# Patient Record
Sex: Male | Born: 1955
Health system: Southern US, Community
[De-identification: ages and names within clinical notes are randomized; demographics above are authoritative.]

## PROBLEM LIST (undated history)

## (undated) DIAGNOSIS — C801 Malignant (primary) neoplasm, unspecified: Secondary | ICD-10-CM

## (undated) DIAGNOSIS — K047 Periapical abscess without sinus: Principal | ICD-10-CM

## (undated) DIAGNOSIS — A159 Respiratory tuberculosis unspecified: Secondary | ICD-10-CM

## (undated) DIAGNOSIS — C8238 Follicular lymphoma grade IIIa, lymph nodes of multiple sites: Secondary | ICD-10-CM

## (undated) DIAGNOSIS — Z531 Procedure and treatment not carried out because of patient's decision for reasons of belief and group pressure: Secondary | ICD-10-CM

## (undated) DIAGNOSIS — M1990 Unspecified osteoarthritis, unspecified site: Secondary | ICD-10-CM

## (undated) DIAGNOSIS — R221 Localized swelling, mass and lump, neck: Secondary | ICD-10-CM

## (undated) HISTORY — DX: Periapical abscess without sinus: K04.7

## (undated) HISTORY — DX: Procedure and treatment not carried out because of patient's decision for reasons of belief and group pressure: Z53.1

## (undated) HISTORY — DX: Follicular lymphoma grade iiia, lymph nodes of multiple sites: C82.38

## (undated) HISTORY — DX: Malignant (primary) neoplasm, unspecified: C80.1

## (undated) HISTORY — PX: CHOLECYSTECTOMY: SHX55

## (undated) HISTORY — PX: VASECTOMY: SHX75

## (undated) HISTORY — PX: COLONOSCOPY: SHX174

---

## 2002-06-30 HISTORY — PX: VASECTOMY REVERSAL: SHX243

## 2007-07-01 DIAGNOSIS — A159 Respiratory tuberculosis unspecified: Secondary | ICD-10-CM

## 2007-07-01 HISTORY — DX: Respiratory tuberculosis unspecified: A15.9

## 2010-06-08 ENCOUNTER — Ambulatory Visit
Admission: RE | Admit: 2010-06-08 | Discharge: 2010-06-08 | Payer: Self-pay | Source: Home / Self Care | Attending: Internal Medicine | Admitting: Internal Medicine

## 2010-06-08 ENCOUNTER — Emergency Department (HOSPITAL_COMMUNITY)
Admission: EM | Admit: 2010-06-08 | Discharge: 2010-06-08 | Payer: Self-pay | Source: Home / Self Care | Admitting: Emergency Medicine

## 2010-06-11 ENCOUNTER — Inpatient Hospital Stay (HOSPITAL_COMMUNITY): Admission: EM | Admit: 2010-06-11 | Discharge: 2010-06-14 | Payer: Self-pay | Source: Home / Self Care

## 2010-06-12 ENCOUNTER — Encounter (INDEPENDENT_AMBULATORY_CARE_PROVIDER_SITE_OTHER): Payer: Self-pay

## 2010-09-09 LAB — COMPREHENSIVE METABOLIC PANEL
Albumin: 2.8 g/dL — ABNORMAL LOW (ref 3.5–5.2)
Alkaline Phosphatase: 211 U/L — ABNORMAL HIGH (ref 39–117)
CO2: 30 mEq/L (ref 19–32)
Calcium: 8.4 mg/dL (ref 8.4–10.5)
Chloride: 100 mEq/L (ref 96–112)
GFR calc Af Amer: >60 mL/min (ref >60)
GFR calc non Af Amer: >60 mL/min (ref >60)
Glucose, Bld: 126 mg/dL — ABNORMAL HIGH (ref 70–99)
Potassium: 4 mEq/L (ref 3.5–5.1)
Sodium: 138 mEq/L (ref 135–145)

## 2010-09-09 LAB — CBC
HCT: 36.9 % — ABNORMAL LOW (ref 39.0–52.0)
MCHC: 33.3 g/dL (ref 30.0–36.0)
Platelets: 247 10*3/uL (ref 150–400)
RDW: 13.8 % (ref 11.5–15.5)

## 2010-09-10 LAB — DIFFERENTIAL
Basophils Relative: 0 % (ref 0–1)
Eosinophils Absolute: 0 10*3/uL (ref 0.0–0.7)
Lymphocytes Relative: 4 % — ABNORMAL LOW (ref 12–46)
Monocytes Absolute: 1.2 10*3/uL — ABNORMAL HIGH (ref 0.1–1.0)
Monocytes Relative: 9 % (ref 3–12)
Neutro Abs: 11.7 10*3/uL — ABNORMAL HIGH (ref 1.7–7.7)
Neutrophils Relative %: 87 % — ABNORMAL HIGH (ref 43–77)

## 2010-09-10 LAB — COMPREHENSIVE METABOLIC PANEL
Albumin: 3.7 g/dL (ref 3.5–5.2)
Alkaline Phosphatase: 192 U/L — ABNORMAL HIGH (ref 39–117)
GFR calc non Af Amer: >60 mL/min (ref >60)
Potassium: 3.5 mEq/L (ref 3.5–5.1)
Sodium: 138 mEq/L (ref 135–145)
Total Bilirubin: 2 mg/dL — ABNORMAL HIGH (ref 0.3–1.2)

## 2010-09-10 LAB — CBC
Hemoglobin: 13.5 g/dL (ref 13.0–17.0)
MCH: 30.2 pg (ref 26.0–34.0)
MCHC: 33.7 g/dL (ref 30.0–36.0)
MCHC: 34.4 g/dL (ref 30.0–36.0)
MCV: 87.9 fL (ref 78.0–100.0)
Platelets: 205 10*3/uL (ref 150–400)
Platelets: 213 10*3/uL (ref 150–400)
RBC: 4.27 MIL/uL (ref 4.22–5.81)
RDW: 13.3 % (ref 11.5–15.5)
RDW: 13.7 % (ref 11.5–15.5)
WBC: 13.4 10*3/uL — ABNORMAL HIGH (ref 4.0–10.5)

## 2010-09-10 LAB — AMYLASE: Amylase: 42 U/L (ref 0–105)

## 2010-09-10 LAB — CREATININE, SERUM
GFR calc Af Amer: >60 mL/min (ref >60)
GFR calc non Af Amer: >60 mL/min (ref >60)

## 2010-09-10 LAB — POTASSIUM: Potassium: 3.7 mEq/L (ref 3.5–5.1)

## 2010-09-10 LAB — HEPATIC FUNCTION PANEL
ALT: 271 U/L — ABNORMAL HIGH (ref 0–53)
AST: 162 U/L — ABNORMAL HIGH (ref 0–37)
Bilirubin, Direct: 0.5 mg/dL — ABNORMAL HIGH (ref 0.0–0.3)
Indirect Bilirubin: 0.9 mg/dL (ref 0.3–0.9)

## 2014-10-31 ENCOUNTER — Other Ambulatory Visit (HOSPITAL_COMMUNITY): Payer: Self-pay | Admitting: Otolaryngology

## 2014-10-31 ENCOUNTER — Other Ambulatory Visit (HOSPITAL_COMMUNITY)
Admission: RE | Admit: 2014-10-31 | Discharge: 2014-10-31 | Disposition: A | Discharge status: Home / Self Care | Payer: 59 | Source: Ambulatory Visit | Attending: Otolaryngology | Admitting: Otolaryngology

## 2014-10-31 DIAGNOSIS — R221 Localized swelling, mass and lump, neck: Secondary | ICD-10-CM | POA: Diagnosis not present

## 2014-11-21 ENCOUNTER — Encounter (HOSPITAL_BASED_OUTPATIENT_CLINIC_OR_DEPARTMENT_OTHER): Payer: Self-pay | Admitting: *Deleted

## 2014-11-28 ENCOUNTER — Encounter (HOSPITAL_BASED_OUTPATIENT_CLINIC_OR_DEPARTMENT_OTHER)
Admission: RE | Disposition: A | Discharge status: Home / Self Care | Payer: Self-pay | Source: Ambulatory Visit | Attending: Otolaryngology

## 2014-11-28 ENCOUNTER — Encounter (HOSPITAL_BASED_OUTPATIENT_CLINIC_OR_DEPARTMENT_OTHER): Payer: Self-pay | Admitting: Certified Registered"

## 2014-11-28 ENCOUNTER — Ambulatory Visit (HOSPITAL_BASED_OUTPATIENT_CLINIC_OR_DEPARTMENT_OTHER)
Admission: RE | Admit: 2014-11-28 | Discharge: 2014-11-28 | Disposition: A | Discharge status: Home / Self Care | Payer: 59 | Source: Ambulatory Visit | Attending: Otolaryngology | Admitting: Otolaryngology

## 2014-11-28 ENCOUNTER — Ambulatory Visit (HOSPITAL_BASED_OUTPATIENT_CLINIC_OR_DEPARTMENT_OTHER): Payer: 59 | Admitting: Certified Registered"

## 2014-11-28 DIAGNOSIS — C8231 Follicular lymphoma grade IIIa, lymph nodes of head, face, and neck: Secondary | ICD-10-CM | POA: Insufficient documentation

## 2014-11-28 HISTORY — PX: MASS BIOPSY: SHX5445

## 2014-11-28 HISTORY — DX: Localized swelling, mass and lump, neck: R22.1

## 2014-11-28 LAB — POCT HEMOGLOBIN-HEMACUE: Hemoglobin: 14.8 g/dL (ref 13.0–17.0)

## 2014-11-28 SURGERY — BIOPSY, MASS, NECK
Anesthesia: General | Site: Neck | Laterality: Left

## 2014-11-28 MED ORDER — DEXAMETHASONE SODIUM PHOSPHATE 4 MG/ML IJ SOLN
INTRAMUSCULAR | Status: DC | PRN
  Administered 2014-11-28: 10 mg via INTRAVENOUS

## 2014-11-28 MED ORDER — LIDOCAINE HCL (CARDIAC) 20 MG/ML IV SOLN
INTRAVENOUS | Status: DC | PRN
  Administered 2014-11-28: 50 mg via INTRAVENOUS

## 2014-11-28 MED ORDER — OXYCODONE HCL 5 MG/5ML PO SOLN: 5.0000 mg | Freq: Once | ORAL | Status: DC | PRN

## 2014-11-28 MED ORDER — PROPOFOL 500 MG/50ML IV EMUL
INTRAVENOUS | Status: AC
  Filled 2014-11-28: qty 50

## 2014-11-28 MED ORDER — KETOROLAC TROMETHAMINE 30 MG/ML IJ SOLN: 30.0000 mg | Freq: Once | INTRAMUSCULAR | Status: DC | PRN

## 2014-11-28 MED ORDER — PROPOFOL 10 MG/ML IV BOLUS
INTRAVENOUS | Status: DC | PRN
  Administered 2014-11-28: 140 mg via INTRAVENOUS

## 2014-11-28 MED ORDER — PROMETHAZINE HCL 25 MG/ML IJ SOLN: 6.2500 mg | INTRAMUSCULAR | Status: DC | PRN

## 2014-11-28 MED ORDER — MIDAZOLAM HCL 2 MG/2ML IJ SOLN
1.0000 mg | INTRAMUSCULAR | Status: DC | PRN
  Administered 2014-11-28: 2 mg via INTRAVENOUS

## 2014-11-28 MED ORDER — OXYMETAZOLINE HCL 0.05 % NA SOLN
NASAL | Status: AC
  Filled 2014-11-28: qty 15

## 2014-11-28 MED ORDER — FENTANYL CITRATE (PF) 100 MCG/2ML IJ SOLN: 50.0000 ug | INTRAMUSCULAR | Status: DC | PRN

## 2014-11-28 MED ORDER — BACITRACIN ZINC 500 UNIT/GM EX OINT
TOPICAL_OINTMENT | CUTANEOUS | Status: AC
  Filled 2014-11-28: qty 0.9

## 2014-11-28 MED ORDER — FENTANYL CITRATE (PF) 100 MCG/2ML IJ SOLN
INTRAMUSCULAR | Status: AC
  Filled 2014-11-28: qty 6

## 2014-11-28 MED ORDER — HYDROMORPHONE HCL 1 MG/ML IJ SOLN: 0.2500 mg | INTRAMUSCULAR | Status: DC | PRN

## 2014-11-28 MED ORDER — FENTANYL CITRATE (PF) 100 MCG/2ML IJ SOLN
INTRAMUSCULAR | Status: DC | PRN
  Administered 2014-11-28: 100 ug via INTRAVENOUS

## 2014-11-28 MED ORDER — LACTATED RINGERS IV SOLN
INTRAVENOUS | Status: DC
  Administered 2014-11-28: 10 mL/h via INTRAVENOUS
  Administered 2014-11-28: 10:00:00 via INTRAVENOUS

## 2014-11-28 MED ORDER — MEPERIDINE HCL 25 MG/ML IJ SOLN: 6.2500 mg | INTRAMUSCULAR | Status: DC | PRN

## 2014-11-28 MED ORDER — OXYCODONE HCL 5 MG PO TABS: 5.0000 mg | ORAL_TABLET | Freq: Once | ORAL | Status: DC | PRN

## 2014-11-28 MED ORDER — GLYCOPYRROLATE 0.2 MG/ML IJ SOLN: 0.2000 mg | Freq: Once | INTRAMUSCULAR | Status: DC | PRN

## 2014-11-28 MED ORDER — SUCCINYLCHOLINE CHLORIDE 20 MG/ML IJ SOLN
INTRAMUSCULAR | Status: AC
  Filled 2014-11-28: qty 2

## 2014-11-28 MED ORDER — LIDOCAINE-EPINEPHRINE 1 %-1:100000 IJ SOLN
INTRAMUSCULAR | Status: AC
  Filled 2014-11-28: qty 1

## 2014-11-28 MED ORDER — ONDANSETRON HCL 4 MG/2ML IJ SOLN
INTRAMUSCULAR | Status: DC | PRN
  Administered 2014-11-28: 4 mg via INTRAVENOUS

## 2014-11-28 MED ORDER — MIDAZOLAM HCL 2 MG/2ML IJ SOLN
INTRAMUSCULAR | Status: AC
  Filled 2014-11-28: qty 2

## 2014-11-28 SURGICAL SUPPLY — 50 items
ATTRACTOMAT 16X20 MAGNETIC DRP (DRAPES) IMPLANT
BLADE SURG 15 STRL LF DISP TIS (BLADE) ×1 IMPLANT
BLADE SURG 15 STRL SS (BLADE) ×1
CANISTER SUCT 1200ML W/VALVE (MISCELLANEOUS) ×2 IMPLANT
CLEANER CAUTERY TIP 5X5 PAD (MISCELLANEOUS) ×1 IMPLANT
CORDS BIPOLAR (ELECTRODE) IMPLANT
COTTONBALL LRG STERILE PKG (GAUZE/BANDAGES/DRESSINGS) IMPLANT
COVER BACK TABLE 60X90IN (DRAPES) ×2 IMPLANT
COVER MAYO STAND STRL (DRAPES) ×2 IMPLANT
DECANTER SPIKE VIAL GLASS SM (MISCELLANEOUS) IMPLANT
DRAIN JACKSON RD 7FR 3/32 (WOUND CARE) IMPLANT
DRAIN PENROSE 1/4X12 LTX STRL (WOUND CARE) IMPLANT
DRAPE U-SHAPE 76X120 STRL (DRAPES) ×2 IMPLANT
DRSG EMULSION OIL 3X3 NADH (GAUZE/BANDAGES/DRESSINGS) IMPLANT
ELECT COATED BLADE 2.86 ST (ELECTRODE) ×2 IMPLANT
ELECT NEEDLE BLADE 2-5/6 (NEEDLE) IMPLANT
ELECT REM PT RETURN 9FT ADLT (ELECTROSURGICAL) ×2
ELECTRODE REM PT RTRN 9FT ADLT (ELECTROSURGICAL) ×1 IMPLANT
EVACUATOR SILICONE 100CC (DRAIN) IMPLANT
GAUZE SPONGE 4X4 16PLY XRAY LF (GAUZE/BANDAGES/DRESSINGS) IMPLANT
GLOVE SS BIOGEL STRL SZ 7.5 (GLOVE) ×1 IMPLANT
GLOVE SUPERSENSE BIOGEL SZ 7.5 (GLOVE) ×1
GOWN STRL REUS W/ TWL LRG LVL3 (GOWN DISPOSABLE) ×2 IMPLANT
GOWN STRL REUS W/TWL LRG LVL3 (GOWN DISPOSABLE) ×2
LIQUID BAND (GAUZE/BANDAGES/DRESSINGS) IMPLANT
LOCATOR NERVE 3 VOLT (DISPOSABLE) IMPLANT
NEEDLE PRECISIONGLIDE 27X1.5 (NEEDLE) ×2 IMPLANT
NS IRRIG 1000ML POUR BTL (IV SOLUTION) IMPLANT
PACK BASIN DAY SURGERY FS (CUSTOM PROCEDURE TRAY) ×2 IMPLANT
PAD CLEANER CAUTERY TIP 5X5 (MISCELLANEOUS) ×1
PENCIL FOOT CONTROL (ELECTRODE) ×2 IMPLANT
SHEET MEDIUM DRAPE 40X70 STRL (DRAPES) IMPLANT
SLEEVE SCD COMPRESS KNEE MED (MISCELLANEOUS) ×2 IMPLANT
SPONGE GAUZE 2X2 8PLY STRL LF (GAUZE/BANDAGES/DRESSINGS) IMPLANT
SPONGE GAUZE 4X4 12PLY STER LF (GAUZE/BANDAGES/DRESSINGS) IMPLANT
STAPLER VISISTAT 35W (STAPLE) IMPLANT
SUCTION FRAZIER TIP 10 FR DISP (SUCTIONS) IMPLANT
SUT CHROMIC 3 0 PS 2 (SUTURE) IMPLANT
SUT CHROMIC 4 0 P 3 18 (SUTURE) IMPLANT
SUT ETHILON 4 0 PS 2 18 (SUTURE) IMPLANT
SUT ETHILON 5 0 P 3 18 (SUTURE)
SUT NYLON ETHILON 5-0 P-3 1X18 (SUTURE) IMPLANT
SUT SILK 3 0 TIES 17X18 (SUTURE)
SUT SILK 3-0 18XBRD TIE BLK (SUTURE) IMPLANT
SUT SILK 4 0 TIES 17X18 (SUTURE) IMPLANT
SYR BULB 3OZ (MISCELLANEOUS) IMPLANT
SYR CONTROL 10ML LL (SYRINGE) ×2 IMPLANT
TOWEL OR 17X24 6PK STRL BLUE (TOWEL DISPOSABLE) IMPLANT
TRAY DSU PREP LF (CUSTOM PROCEDURE TRAY) ×2 IMPLANT
TUBE CONNECTING 20X1/4 (TUBING) ×2 IMPLANT

## 2014-11-28 NOTE — Transfer of Care (Signed)
Immediate Anesthesia Transfer of Care Note  Patient: Travis Burton  Procedure(s) Performed: Procedure(s): OPEN  BIOPSY LEFT NECK MASS (Left)  Patient Location: PACU  Anesthesia Type:General  Level of Consciousness: awake, sedated and responds to stimulation  Airway & Oxygen Therapy: Patient Spontanous Breathing and Patient connected to face mask oxygen  Post-op Assessment: Report given to RN, Post -op Vital signs reviewed and stable and Patient moving all extremities  Post vital signs: Reviewed and stable  Last Vitals:  Filed Vitals:   11/28/14 0919  BP: 151/79  Pulse: 78  Temp: 36.6 C  Resp: 16    Complications: No apparent anesthesia complications

## 2014-11-28 NOTE — H&P (Signed)
Travis Burton is an 59 y.o. male.   Chief Complaint: left neck mass HPI: hx of mass in the neck and needs biopsy  Past Medical History  Diagnosis Date  . Neck mass     left    Past Surgical History  Procedure Laterality Date  . Cholecystectomy    . Vasectomy      History reviewed. No pertinent family history. Social History:  reports that he has never smoked. He does not have any smokeless tobacco history on file. He reports that he drinks alcohol. He reports that he does not use illicit drugs.  Allergies: No Known Allergies  No prescriptions prior to admission    Results for orders placed or performed during the hospital encounter of 11/28/14 (from the past 48 hour(s))  Hemoglobin-hemacue, POC     Status: None   Collection Time: 11/28/14  9:35 AM  Result Value Ref Range   Hemoglobin 14.8 13.0 - 17.0 g/dL   No results found.  Review of Systems  Constitutional: Negative.   HENT: Negative.   Eyes: Negative.   Respiratory: Negative.   Cardiovascular: Negative.   Skin: Negative.   Neurological: Negative.     Blood pressure 151/79, pulse 78, temperature 97.8 F (36.6 C), temperature source Oral, resp. rate 16, height 5\' 7"  (1.702 m), weight 67.132 kg (148 lb), SpO2 100 %. Physical Exam  Constitutional: He appears well-developed and well-nourished.  HENT:  Head: Normocephalic and atraumatic.  Nose: Nose normal.  Mouth/Throat: Oropharynx is clear and moist.  Eyes: Pupils are equal, round, and reactive to light.  Cardiovascular: Normal rate.   Respiratory: Effort normal.  GI: Soft.  Musculoskeletal: Normal range of motion.  Lymphadenopathy:    He has cervical adenopathy.     Assessment/Plan Left neck mass- discussed biopsy and ready to proceed  Melissa Montane 11/28/2014, 9:57 AM

## 2014-11-28 NOTE — Anesthesia Preprocedure Evaluation (Signed)
Anesthesia Evaluation  Patient identified by MRN, date of birth, ID band Patient awake    Reviewed: Allergy & Precautions, NPO status , Patient's Chart, lab work & pertinent test results  Airway Mallampati: II  TM Distance: >3 FB Neck ROM: Full    Dental no notable dental hx.    Pulmonary neg pulmonary ROS,  breath sounds clear to auscultation  Pulmonary exam normal       Cardiovascular negative cardio ROS Normal cardiovascular examRhythm:Regular Rate:Normal     Neuro/Psych negative neurological ROS  negative psych ROS   GI/Hepatic negative GI ROS, Neg liver ROS,   Endo/Other  negative endocrine ROS  Renal/GU negative Renal ROS     Musculoskeletal negative musculoskeletal ROS (+)   Abdominal   Peds  Hematology negative hematology ROS (+)   Anesthesia Other Findings   Reproductive/Obstetrics                             Anesthesia Physical Anesthesia Plan  ASA: I  Anesthesia Plan: General   Post-op Pain Management:    Induction: Intravenous  Airway Management Planned: LMA and Oral ETT  Additional Equipment: None  Intra-op Plan:   Post-operative Plan: Extubation in OR  Informed Consent: I have reviewed the patients History and Physical, chart, labs and discussed the procedure including the risks, benefits and alternatives for the proposed anesthesia with the patient or authorized representative who has indicated his/her understanding and acceptance.   Dental advisory given  Plan Discussed with: CRNA  Anesthesia Plan Comments:         Anesthesia Quick Evaluation

## 2014-11-28 NOTE — Anesthesia Postprocedure Evaluation (Signed)
Anesthesia Post Note  Patient: Travis Burton  Procedure(s) Performed: Procedure(s) (LRB): OPEN  BIOPSY LEFT NECK MASS (Left)  Anesthesia type: General  Patient location: PACU  Post pain: Pain level controlled  Post assessment: Post-op Vital signs reviewed  Last Vitals: BP 133/77 mmHg  Pulse 57  Temp(Src) 36.4 C (Oral)  Resp 16  Ht 5\' 7"  (1.702 m)  Wt 148 lb (67.132 kg)  BMI 23.17 kg/m2  SpO2 100%  Post vital signs: Reviewed  Level of consciousness: sedated  Complications: No apparent anesthesia complications

## 2014-11-28 NOTE — Discharge Instructions (Addendum)
Follow-up in one week. Keep the wound dry for a few days but then it can get wet as it has glue closing the skin. Do not place any petroleum or anabiotic cream products on the wound. Call if there's any increasing redness, pain, swelling as these are signs of infection. Call with any other questions or abnormalities .   Post Anesthesia Home Care Instructions  Activity: Get plenty of rest for the remainder of the day. A responsible adult should stay with you for 24 hours following the procedure.  For the next 24 hours, DO NOT: -Drive a car -Paediatric nurse -Drink alcoholic beverages -Take any medication unless instructed by your physician -Make any legal decisions or sign important papers.  Meals: Start with liquid foods such as gelatin or soup. Progress to regular foods as tolerated. Avoid greasy, spicy, heavy foods. If nausea and/or vomiting occur, drink only clear liquids until the nausea and/or vomiting subsides. Call your physician if vomiting continues.  Special Instructions/Symptoms: Your throat may feel dry or sore from the anesthesia or the breathing tube placed in your throat during surgery. If this causes discomfort, gargle with warm salt water. The discomfort should disappear within 24 hours.  If you had a scopolamine patch placed behind your ear for the management of post- operative nausea and/or vomiting:  1. The medication in the patch is effective for 72 hours, after which it should be removed.  Wrap patch in a tissue and discard in the trash. Wash hands thoroughly with soap and water. 2. You may remove the patch earlier than 72 hours if you experience unpleasant side effects which may include dry mouth, dizziness or visual disturbances. 3. Avoid touching the patch. Wash your hands with soap and water after contact with the patch.

## 2014-11-28 NOTE — Op Note (Signed)
Preop/postop diagnosis: Left neck mass Procedure: Biopsy of left neck mass Anesthesia: Gen. Estimated blood loss: Less than 5 mL Indications: 59 year old with a large mass in the left neck that a needle biopsy showed to be lymphocytes. The mass is concerning for a lymphoma. He now needs an open biopsy. He was informed risks and benefits of the procedure and options were discussed all questions are answered and consent was obtained. Procedure: Patient was taken to the operating room placed in the supine position after general endotracheal tube anesthesia was placed in the right gaze position. The mass was identified and very large. The posterior edge of the muscle was identified and incision was made in that location anterior to the external jugular vein. Dissection was carried through the platysma muscle. The tumor was easily identified and right close to the surface under the platysma muscle. It was opened exposing it and then with the cautery and scissor dissection the mass was cut for biopsy removing a section of its lateral aspect. This was sent for fresh specimen. Cautery was used to obtain hemostasis of the defect of the mass. As the mass was so superficial and easily identified with thin layers of dissection over its lateral aspect there was no need or ability to go find or identified the spinal accessory nerve.the wound had good hemostasis. The subplatysmal area was closed with interrupted 4-0 chromic and Dermabond to close the skin. The patient was awakened brought to recovery room in stable condition counts correct

## 2014-11-28 NOTE — Anesthesia Procedure Notes (Signed)
Procedure Name: LMA Insertion Date/Time: 11/28/2014 10:19 AM Performed by: Baxter Flattery Pre-anesthesia Checklist: Patient identified, Emergency Drugs available, Suction available and Patient being monitored Patient Re-evaluated:Patient Re-evaluated prior to inductionOxygen Delivery Method: Circle System Utilized Preoxygenation: Pre-oxygenation with 100% oxygen Intubation Type: IV induction Ventilation: Mask ventilation without difficulty LMA: LMA inserted LMA Size: 4.0 Number of attempts: 1 Airway Equipment and Method: Bite block Placement Confirmation: positive ETCO2 and breath sounds checked- equal and bilateral Tube secured with: Tape Dental Injury: Teeth and Oropharynx as per pre-operative assessment

## 2014-11-29 ENCOUNTER — Encounter (HOSPITAL_BASED_OUTPATIENT_CLINIC_OR_DEPARTMENT_OTHER): Payer: Self-pay | Admitting: Otolaryngology

## 2014-12-08 ENCOUNTER — Telehealth: Payer: Self-pay | Admitting: Hematology and Oncology

## 2014-12-08 NOTE — Telephone Encounter (Signed)
new patient referral-s/w patient and gave np appt for 06/17 @ 9:30 w/Dr. Alvy Bimler. Referring Dr. Janace Hoard Authorzation obtain from PCP Dr. Leighton Ruff

## 2014-12-15 ENCOUNTER — Telehealth: Payer: Self-pay | Admitting: *Deleted

## 2014-12-15 ENCOUNTER — Ambulatory Visit (HOSPITAL_BASED_OUTPATIENT_CLINIC_OR_DEPARTMENT_OTHER): Payer: 59 | Admitting: Hematology and Oncology

## 2014-12-15 ENCOUNTER — Ambulatory Visit: Payer: 59

## 2014-12-15 ENCOUNTER — Encounter: Payer: Self-pay | Admitting: Hematology and Oncology

## 2014-12-15 ENCOUNTER — Telehealth: Payer: Self-pay | Admitting: Hematology and Oncology

## 2014-12-15 ENCOUNTER — Other Ambulatory Visit: Payer: Self-pay | Admitting: Hematology and Oncology

## 2014-12-15 VITALS — BP 139/72 | HR 77 | Temp 98.5°F | Resp 18 | Ht 67.0 in | Wt 147.4 lb

## 2014-12-15 DIAGNOSIS — M542 Cervicalgia: Secondary | ICD-10-CM | POA: Diagnosis not present

## 2014-12-15 DIAGNOSIS — C8238 Follicular lymphoma grade IIIa, lymph nodes of multiple sites: Secondary | ICD-10-CM

## 2014-12-15 DIAGNOSIS — C8230 Follicular lymphoma grade IIIa, unspecified site: Secondary | ICD-10-CM

## 2014-12-15 DIAGNOSIS — Z8572 Personal history of non-Hodgkin lymphomas: Secondary | ICD-10-CM | POA: Insufficient documentation

## 2014-12-15 HISTORY — DX: Follicular lymphoma grade iiia, lymph nodes of multiple sites: C82.38

## 2014-12-15 NOTE — Progress Notes (Signed)
[facility] CONSULT NOTE  Patient Care Team: Leighton Ruff, MD as PCP - General (Family Medicine) Melissa Montane, MD as Consulting Physician (Otolaryngology) Heath Lark, MD as Consulting Physician (Hematology and Oncology)  CHIEF COMPLAINTS/PURPOSE OF CONSULTATION:  Newly diagnosed high-grade follicular lymphoma  HISTORY OF PRESENTING ILLNESS:  Travis Burton 59 y.o. male is here because of recent diagnosis of lymphoma. He self palpated a neck mass around 2 months ago and underwent further evaluation. Summary of oncologic history is as follows:   Follicular lymphoma grade 3a   10/31/2014 Pathology Results Accession: [id number] FNA showed atypical lymphoid population   11/28/2014 Pathology Results Accession: [id number] Biopsy showed high grade follicular lymphoma   5/39/7673 Surgery He had open biopsy of left neck mass   Postoperatively, he complained of significant left neck pain, chest wall numbness and left shoulder pain. He complained of some mild weight loss but it was intentional weight loss. He has occasional night sweats. Denies anorexia or skin itching. He denies lymphadenopathy elsewhere. He complained of mild lower abdominal discomfort recently. Denies any change in bowel habits.  MEDICAL HISTORY:  Past Medical History  Diagnosis Date  . Neck mass     left  . Cancer     lymphoma    SURGICAL HISTORY: Past Surgical History  Procedure Laterality Date  . Cholecystectomy    . Vasectomy    . Mass biopsy Left 11/28/2014    Procedure: OPEN  BIOPSY LEFT NECK MASS;  Surgeon: Melissa Montane, MD;  Location: [facility];  Service: ENT;  Laterality: Left;    SOCIAL HISTORY: History   Social History  . Marital Status: Married    Spouse Name: N/A  . Number of Children: N/A  . Years of Education: N/A   Occupational History  . Not on file.   Social History Main Topics  . Smoking status: Never Smoker   . Smokeless tobacco: Never Used  . Alcohol  Use: Yes     Comment: social  . Drug Use: No  . Sexual Activity: Yes   Other Topics Concern  . Not on file   Social History Narrative    FAMILY HISTORY: Family History  Problem Relation Age of Onset  . Cancer Mother 67    breast ca    ALLERGIES:  has No Known Allergies.  MEDICATIONS:  No current outpatient prescriptions on file.   No current facility-administered medications for this visit.    REVIEW OF SYSTEMS:   Constitutional: Denies fevers, chills or abnormal night sweats Eyes: Denies blurriness of vision, double vision or watery eyes Ears, nose, mouth, throat, and face: Denies mucositis or sore throat Respiratory: Denies cough, dyspnea or wheezes Cardiovascular: Denies palpitation, chest discomfort or lower extremity swelling Gastrointestinal:  Denies nausea, heartburn or change in bowel habits Skin: Denies abnormal skin rashes Neurological:Denies numbness, tingling or new weaknesses Behavioral/Psych: Mood is stable, no new changes  All other systems were reviewed with the patient and are negative.  PHYSICAL EXAMINATION: ECOG PERFORMANCE STATUS: 1 - Symptomatic but completely ambulatory  Filed Vitals:   12/15/14 0930  BP: 139/72  Pulse: 77  Temp: 98.5 F (36.9 C)  Resp: 18   Filed Weights   12/15/14 0930  Weight: 147 lb 6.4 oz (66.86 kg)    GENERAL:alert, no distress and comfortable SKIN: skin color, texture, turgor are normal, no rashes or significant lesions EYES: normal, conjunctiva are pink and non-injected, sclera clear OROPHARYNX:no exudate, no erythema and lips, buccal mucosa, and tongue normal  NECK: Well-healed surgical scar in the left neck.  LYMPH:  Palpable lymphadenopathy in the left neck/supraclavicular region.  LUNGS: clear to auscultation and percussion with normal breathing effort HEART: regular rate & rhythm and no murmurs and no lower extremity edema ABDOMEN:abdomen soft, non-tender with palpable fullness in the periumbilical  region with normal bowel sounds  Musculoskeletal:no cyanosis of digits and no clubbing  PSYCH: alert & oriented x 3 with fluent speech NEURO: no focal motor/sensory deficits  LABORATORY DATA:  I have reviewed the data as listed Lab Results  Component Value Date   WBC 9.9 06/13/2010   HGB 14.8 11/28/2014   HCT 36.9* 06/13/2010   MCV 90.2 06/13/2010   PLT 247 06/13/2010   No results for input(s): NA, K, CL, CO2, GLUCOSE, BUN, CREATININE, CALCIUM, GFRNONAA, GFRAA, PROT, ALBUMIN, AST, ALT, ALKPHOS, BILITOT, BILIDIR, IBILI in the last 8760 hours.  ASSESSMENT & PLAN:  Follicular lymphoma grade 3a I reviewed with him the disease process. With high-grade lymphoma, I will like to start him on treatment as soon as possible. The patient shared with me his financial constraints. I will proceed to order blood work, echocardiogram, PET/CT scan, staging bone marrow aspirate and biopsy and hematology education class next week. I will see him back in about 10 days to review all test results with the plan to start him on R-CHOP chemotherapy in the future. He is concerned about port placement and the start date of chemotherapy. We will discuss that further on his return visits.   Neck pain on left side He complained of significant left sided neck pain and numbness around his chest wall and shoulder. I recommend he contacts his ENT office for further recommendation and evaluation.     All questions were answered. The patient knows to call the clinic with any problems, questions or concerns. I spent 40 minutes counseling the patient face to face. The total time spent in the appointment was 60 minutes and more than 50% was on counseling.     [place], [place], MD 12/15/2014 11:23 AM

## 2014-12-15 NOTE — Assessment & Plan Note (Signed)
I reviewed with him the disease process. With high-grade lymphoma, I will like to start him on treatment as soon as possible. The patient shared with me his financial constraints. I will proceed to order blood work, echocardiogram, PET/CT scan, staging bone marrow aspirate and biopsy and hematology education class next week. I will see him back in about 10 days to review all test results with the plan to start him on R-CHOP chemotherapy in the future. He is concerned about port placement and the start date of chemotherapy. We will discuss that further on his return visits.

## 2014-12-15 NOTE — Progress Notes (Signed)
Checked in new pt with no financial concerns. °

## 2014-12-15 NOTE — Telephone Encounter (Signed)
Per staff phone call and POF I have schedueld appts. Scheduler advised of appts.  JMW  

## 2014-12-15 NOTE — Assessment & Plan Note (Signed)
He complained of significant left sided neck pain and numbness around his chest wall and shoulder. I recommend he contacts his ENT office for further recommendation and evaluation.

## 2014-12-15 NOTE — Telephone Encounter (Signed)
perpof to sch pt appt-cld Travis Burton for pre-cert for [facility] for ECHO [id number] ECHO-Mw sch biopsy & chemo-gave pt avs-adv [facility] to call & sch PET

## 2014-12-18 ENCOUNTER — Encounter: Payer: Self-pay | Admitting: *Deleted

## 2014-12-19 ENCOUNTER — Telehealth: Payer: Self-pay | Admitting: Hematology and Oncology

## 2014-12-19 NOTE — Telephone Encounter (Signed)
lvm for pt regarding to 6.27 appts moved to 6.24.Marland KitchenMarland KitchenMarland Kitchen

## 2014-12-20 NOTE — Telephone Encounter (Signed)
Called pt to make sure he is aware of appts this Friday 6/24.  He confirmed appts.

## 2014-12-22 ENCOUNTER — Ambulatory Visit (HOSPITAL_BASED_OUTPATIENT_CLINIC_OR_DEPARTMENT_OTHER)
Admission: RE | Admit: 2014-12-22 | Discharge: 2014-12-22 | Disposition: A | Discharge status: Home / Self Care | Payer: 59 | Source: Ambulatory Visit | Attending: Hematology and Oncology | Admitting: Hematology and Oncology

## 2014-12-22 ENCOUNTER — Other Ambulatory Visit (HOSPITAL_BASED_OUTPATIENT_CLINIC_OR_DEPARTMENT_OTHER): Payer: 59

## 2014-12-22 ENCOUNTER — Encounter: Payer: Self-pay | Admitting: Hematology and Oncology

## 2014-12-22 ENCOUNTER — Other Ambulatory Visit: Payer: Self-pay | Admitting: Hematology and Oncology

## 2014-12-22 ENCOUNTER — Ambulatory Visit (HOSPITAL_COMMUNITY)
Admission: RE | Admit: 2014-12-22 | Discharge: 2014-12-22 | Disposition: A | Discharge status: Home / Self Care | Payer: 59 | Source: Ambulatory Visit | Attending: Hematology and Oncology | Admitting: Hematology and Oncology

## 2014-12-22 ENCOUNTER — Other Ambulatory Visit (HOSPITAL_COMMUNITY)
Admission: RE | Admit: 2014-12-22 | Discharge: 2014-12-22 | Disposition: A | Discharge status: Home / Self Care | Payer: 59 | Source: Ambulatory Visit | Attending: Hematology and Oncology | Admitting: Hematology and Oncology

## 2014-12-22 ENCOUNTER — Ambulatory Visit (HOSPITAL_BASED_OUTPATIENT_CLINIC_OR_DEPARTMENT_OTHER): Payer: 59 | Admitting: Hematology and Oncology

## 2014-12-22 ENCOUNTER — Ambulatory Visit (HOSPITAL_COMMUNITY): Payer: 59

## 2014-12-22 VITALS — BP 137/78 | HR 62 | Temp 98.4°F | Resp 18

## 2014-12-22 DIAGNOSIS — C8223 Follicular lymphoma grade III, unspecified, intra-abdominal lymph nodes: Secondary | ICD-10-CM | POA: Diagnosis not present

## 2014-12-22 DIAGNOSIS — C8230 Follicular lymphoma grade IIIa, unspecified site: Secondary | ICD-10-CM

## 2014-12-22 DIAGNOSIS — C8221 Follicular lymphoma grade III, unspecified, lymph nodes of head, face, and neck: Secondary | ICD-10-CM | POA: Diagnosis not present

## 2014-12-22 DIAGNOSIS — Z01818 Encounter for other preprocedural examination: Secondary | ICD-10-CM | POA: Diagnosis not present

## 2014-12-22 DIAGNOSIS — I071 Rheumatic tricuspid insufficiency: Secondary | ICD-10-CM | POA: Diagnosis not present

## 2014-12-22 LAB — URIC ACID (CC13): Uric Acid, Serum: 5.8 mg/dl (ref 2.6–7.4)

## 2014-12-22 LAB — CBC WITH DIFFERENTIAL/PLATELET
BASO%: 0.4 % (ref 0.0–2.0)
Basophils Absolute: 0 10*3/uL (ref 0.0–0.1)
EOS%: 3.7 % (ref 0.0–7.0)
Eosinophils Absolute: 0.1 10*3/uL (ref 0.0–0.5)
HEMATOCRIT: 39.6 % (ref 38.4–49.9)
HGB: 13.3 g/dL (ref 13.0–17.1)
LYMPH%: 17.6 % (ref 14.0–49.0)
MCH: 29.7 pg (ref 27.2–33.4)
MCHC: 33.5 g/dL (ref 32.0–36.0)
MCV: 88.7 fL (ref 79.3–98.0)
MONO#: 0.4 10*3/uL (ref 0.1–0.9)
MONO%: 11.3 % (ref 0.0–14.0)
NEUT#: 2.3 10*3/uL (ref 1.5–6.5)
NEUT%: 67 % (ref 39.0–75.0)
Platelets: 229 10*3/uL (ref 140–400)
RBC: 4.47 10*6/uL (ref 4.20–5.82)
RDW: 13.8 % (ref 11.0–14.6)
WBC: 3.4 10*3/uL — ABNORMAL LOW (ref 4.0–10.3)
lymph#: 0.6 10*3/uL — ABNORMAL LOW (ref 0.9–3.3)

## 2014-12-22 LAB — HEPATITIS B SURFACE ANTIBODY,QUALITATIVE: Hep B S Ab: NEGATIVE

## 2014-12-22 LAB — COMPREHENSIVE METABOLIC PANEL (CC13)
ALT: 15 U/L (ref 0–55)
ANION GAP: 6 meq/L (ref 3–11)
AST: 20 U/L (ref 5–34)
Albumin: 4.4 g/dL (ref 3.5–5.0)
Alkaline Phosphatase: 75 U/L (ref 40–150)
BUN: 17 mg/dL (ref 7.0–26.0)
CHLORIDE: 106 meq/L (ref 98–109)
CO2: 29 meq/L (ref 22–29)
CREATININE: 0.8 mg/dL (ref 0.7–1.3)
Calcium: 9.2 mg/dL (ref 8.4–10.4)
EGFR: >90 mL/min/{1.73_m2} (ref >90)
Glucose: 78 mg/dl (ref 70–140)
POTASSIUM: 4 meq/L (ref 3.5–5.1)
Sodium: 141 mEq/L (ref 136–145)
Total Bilirubin: 0.84 mg/dL (ref 0.20–1.20)
Total Protein: 6.5 g/dL (ref 6.4–8.3)

## 2014-12-22 LAB — HEPATITIS B SURFACE ANTIGEN: Hepatitis B Surface Ag: NEGATIVE

## 2014-12-22 LAB — HEPATITIS B CORE ANTIBODY, IGM: Hep B C IgM: NONREACTIVE

## 2014-12-22 LAB — GLUCOSE, CAPILLARY: GLUCOSE-CAPILLARY: 75 mg/dL (ref 65–99)

## 2014-12-22 LAB — BONE MARROW EXAM

## 2014-12-22 LAB — LACTATE DEHYDROGENASE (CC13): LDH: 147 U/L (ref 125–245)

## 2014-12-22 MED ORDER — FLUDEOXYGLUCOSE F - 18 (FDG) INJECTION
7.6000 | Freq: Once | INTRAVENOUS | Status: AC | PRN
  Administered 2014-12-22: 7.6 via INTRAVENOUS

## 2014-12-22 NOTE — Patient Instructions (Signed)
Bone Marrow Aspiration, Bone Marrow Biopsy  Care After  Read the instructions outlined below and refer to this sheet in the next few weeks. These discharge instructions provide you with general information on caring for yourself after you leave the hospital. Your caregiver may also give you specific instructions. While your treatment has been planned according to the most current medical practices available, unavoidable complications occasionally occur. If you have any problems or questions after discharge, call your caregiver.  FINDING OUT THE RESULTS OF YOUR TEST  Not all test results are available during your visit. If your test results are not back during the visit, make an appointment with your caregiver to find out the results. Do not assume everything is normal if you have not heard from your caregiver or the medical facility. It is important for you to follow up on all of your test results.   HOME CARE INSTRUCTIONS   You have had sedation and may be sleepy or dizzy. Your thinking may not be as clear as usual. For the next 24 hours:  · Only take over-the-counter or prescription medicines for pain, discomfort, and or fever as directed by your caregiver.  · Do not drink alcohol.  · Do not smoke.  · Do not drive.  · Do not make important legal decisions.  · Do not operate heavy machinery.  · Do not care for small children by yourself.  · Keep your dressing clean and dry. You may replace dressing with a bandage after 24 hours.  · You may take a bath or shower after 24 hours.  · Use an ice pack for 20 minutes every 2 hours while awake for pain as needed.  SEEK MEDICAL CARE IF:   · There is redness, swelling, or increasing pain at the biopsy site.  · There is pus coming from the biopsy site.  · There is drainage from a biopsy site lasting longer than one day.  · An unexplained oral temperature above 102° F (38.9° C) develops.  SEEK IMMEDIATE MEDICAL CARE IF:   · You develop a rash.  · You have difficulty  breathing.  · You develop any reaction or side effects to medications given.  Document Released: 01/03/2005 Document Revised: 09/08/2011 Document Reviewed: 06/13/2008  ExitCare® Patient Information ©2015 [employer]. This information is not intended to replace advice given to you by your health care provider. Make sure you discuss any questions you have with your health care provider.

## 2014-12-22 NOTE — Progress Notes (Signed)
1040 pt sent to lab after observation; bmbx site care explained to pt, pt voices unerstanding.

## 2014-12-22 NOTE — Progress Notes (Signed)
  Echocardiogram 2D Echocardiogram has been performed.  Jennette Dubin 12/22/2014, 1:11 PM

## 2014-12-22 NOTE — Progress Notes (Signed)
The patient was diagnosed with follicular lymphoma. He again understands the importance of staging bone marrow aspirate and biopsy. The details of the procedure is fully discussed with the patient and he agreed to proceed.  Bone Marrow Biopsy and Aspiration Procedure Note   Informed consent was obtained and potential risks including bleeding, infection and pain were reviewed with the patient. The patient's name, date of birth, identification, consent and allergies were verified prior to the start of procedure and time out was performed. The right posterior iliac crest was chosen as the site of biopsy.  The skin was prepped with Betadine solution.   8 cc of 1% lidocaine was used to provide local anaesthesia.   10 cc of bone marrow aspirate was obtained followed by 1 inch biopsy.   The procedure was tolerated well and there were no complications.  The patient was stable at the end of the procedure.  Specimens sent for flow cytometry, cytogenetics and additional studies.  Spent 30 minutes on the procedure.

## 2014-12-22 NOTE — Progress Notes (Signed)
1010-BMBX completed to right hip. Time out procedure completed with this RN, Dr. Alvy Bimler, and Butch Penny, lab tech present. Site clean/dry, no bleeding noted. Bandaid placed to site. Pt instructed to lie on back x 30 mins. Pt voices understanding.

## 2014-12-25 ENCOUNTER — Other Ambulatory Visit: Payer: 59

## 2014-12-25 ENCOUNTER — Ambulatory Visit (HOSPITAL_COMMUNITY): Payer: 59

## 2014-12-25 ENCOUNTER — Other Ambulatory Visit (HOSPITAL_COMMUNITY): Payer: 59

## 2014-12-26 ENCOUNTER — Encounter: Payer: Self-pay | Admitting: Hematology and Oncology

## 2014-12-26 ENCOUNTER — Encounter: Payer: Self-pay | Admitting: *Deleted

## 2014-12-26 ENCOUNTER — Ambulatory Visit (HOSPITAL_BASED_OUTPATIENT_CLINIC_OR_DEPARTMENT_OTHER): Payer: 59 | Admitting: Hematology and Oncology

## 2014-12-26 ENCOUNTER — Telehealth: Payer: Self-pay | Admitting: Hematology and Oncology

## 2014-12-26 ENCOUNTER — Telehealth: Payer: Self-pay | Admitting: *Deleted

## 2014-12-26 ENCOUNTER — Other Ambulatory Visit: Payer: 59

## 2014-12-26 VITALS — BP 132/75 | HR 65 | Temp 97.8°F | Resp 18 | Ht 67.0 in | Wt 146.6 lb

## 2014-12-26 DIAGNOSIS — C8230 Follicular lymphoma grade IIIa, unspecified site: Secondary | ICD-10-CM | POA: Diagnosis not present

## 2014-12-26 MED ORDER — ONDANSETRON HCL 8 MG PO TABS: 8.0000 mg | ORAL_TABLET | Freq: Three times a day (TID) | ORAL | Status: DC | PRN

## 2014-12-26 MED ORDER — LIDOCAINE-PRILOCAINE 2.5-2.5 % EX CREA: TOPICAL_CREAM | CUTANEOUS | Status: DC

## 2014-12-26 MED ORDER — ALLOPURINOL 300 MG PO TABS: 300.0000 mg | ORAL_TABLET | Freq: Every day | ORAL | Status: DC

## 2014-12-26 MED ORDER — PREDNISONE 20 MG PO TABS: 60.0000 mg | ORAL_TABLET | Freq: Every day | ORAL | Status: DC

## 2014-12-26 MED ORDER — PROCHLORPERAZINE MALEATE 10 MG PO TABS: 10.0000 mg | ORAL_TABLET | Freq: Four times a day (QID) | ORAL | Status: DC | PRN

## 2014-12-26 NOTE — Telephone Encounter (Signed)
Per staff message and POF I have scheduled appts. Advised scheduler of appts. JMW  

## 2014-12-26 NOTE — Telephone Encounter (Signed)
per pof to sch pt appt-adv pt IR will; call in regard to port-cld Tiffany to remind to call pt-sent MW email to r/s rmt per pof-pt stated will look on MY CHART for update-gave avs

## 2014-12-26 NOTE — Telephone Encounter (Signed)
per pof to sch pt appt-gave pt copy of avs-pof stated no date-pt stated was told 7/28

## 2014-12-27 ENCOUNTER — Ambulatory Visit: Payer: 59

## 2014-12-27 NOTE — Progress Notes (Signed)
[facility] OFFICE PROGRESS NOTE  Patient Care Team: Juluis Rainier, MD as PCP - General (Family Medicine) Suzanna Obey, MD as Consulting Physician (Otolaryngology) Artis Delay, MD as Consulting Physician (Hematology and Oncology)  SUMMARY OF ONCOLOGIC HISTORY:   Follicular lymphoma grade 3a   10/31/2014 Pathology Results Accession: [id number] FNA showed atypical lymphoid population   11/28/2014 Pathology Results Accession: [id number] Biopsy showed high grade follicular lymphoma   11/28/2014 Surgery He had open biopsy of left neck mass   12/22/2014 Imaging [facility] showed normal EF   12/22/2014 Imaging PET scan showed stage III disease   12/22/2014 Bone Marrow Biopsy Bone marrow biopsy was negative    INTERVAL HISTORY: Please see below for problem oriented charting. He returns today to review test results.  REVIEW OF SYSTEMS:   Constitutional: Denies fevers, chills or abnormal weight loss Eyes: Denies blurriness of vision Ears, nose, mouth, throat, and face: Denies mucositis or sore throat Respiratory: Denies cough, dyspnea or wheezes Cardiovascular: Denies palpitation, chest discomfort or lower extremity swelling Gastrointestinal:  Denies nausea, heartburn or change in bowel habits Skin: Denies abnormal skin rashes Lymphatics: Denies new lymphadenopathy or easy bruising Neurological:Denies numbness, tingling or new weaknesses Behavioral/Psych: Mood is stable, no new changes  All other systems were reviewed with the patient and are negative.  I have reviewed the past medical history, past surgical history, social history and family history with the patient and they are unchanged from previous note.  ALLERGIES:  has No Known Allergies.  MEDICATIONS:  Current Outpatient Prescriptions  Medication Sig Dispense Refill  . allopurinol (ZYLOPRIM) 300 MG tablet Take 1 tablet (300 mg total) by mouth daily. 30 tablet 3  . lidocaine-prilocaine (EMLA) cream Apply to affected area once  30 g 3  . ondansetron (ZOFRAN) 8 MG tablet Take 1 tablet (8 mg total) by mouth every 8 (eight) hours as needed. 30 tablet 1  . predniSONE (DELTASONE) 20 MG tablet Take 3 tablets (60 mg total) by mouth daily with breakfast. Take on days 2-5 of chemotherapy. 12 tablet 6  . prochlorperazine (COMPAZINE) 10 MG tablet Take 1 tablet (10 mg total) by mouth every 6 (six) hours as needed (Nausea or vomiting). 30 tablet 6   No current facility-administered medications for this visit.    PHYSICAL EXAMINATION: ECOG PERFORMANCE STATUS: 1 - Symptomatic but completely ambulatory  Filed Vitals:   12/26/14 1141  BP: 132/75  Pulse: 65  Temp: 97.8 F (36.6 C)  Resp: 18   Filed Weights   12/26/14 1141  Weight: 146 lb 9.6 oz (66.497 kg)    GENERAL:alert, no distress and comfortable SKIN: skin color, texture, turgor are normal, no rashes or significant lesions EYES: normal, Conjunctiva are pink and non-injected, sclera clear LYMPH:  He has persistent lymphadenopathy in the left side of his neck.  Musculoskeletal:no cyanosis of digits and no clubbing  NEURO: alert & oriented x 3 with fluent speech, no focal motor/sensory deficits  LABORATORY DATA:  I have reviewed the data as listed    Component Value Date/Time   NA 141 12/22/2014 1055   NA 138 06/13/2010 1000   K 4.0 12/22/2014 1055   K 4.0 06/13/2010 1000   CL 100 06/13/2010 1000   CO2 29 12/22/2014 1055   CO2 30 06/13/2010 1000   GLUCOSE 78 12/22/2014 1055   GLUCOSE 126* 06/13/2010 1000   BUN 17.0 12/22/2014 1055   BUN 4* 06/13/2010 1000   CREATININE 0.8 12/22/2014 1055   CREATININE 0.75 06/13/2010 1000  CALCIUM 9.2 12/22/2014 1055   CALCIUM 8.4 06/13/2010 1000   PROT 6.5 12/22/2014 1055   PROT 5.9* 06/13/2010 1000   ALBUMIN 4.4 12/22/2014 1055   ALBUMIN 2.8* 06/13/2010 1000   AST 20 12/22/2014 1055   AST 109* 06/13/2010 1000   ALT 15 12/22/2014 1055   ALT 217* 06/13/2010 1000   ALKPHOS 75 12/22/2014 1055   ALKPHOS 211*  06/13/2010 1000   BILITOT 0.84 12/22/2014 1055   BILITOT 0.8 06/13/2010 1000   GFRNONAA >60 06/13/2010 1000   GFRAA  06/13/2010 1000    >60        The eGFR has been calculated using the MDRD equation. This calculation has not been validated in all clinical situations. eGFR's persistently <60 mL/min signify possible Chronic Kidney Disease.    No results found for: SPEP, UPEP  Lab Results  Component Value Date   WBC 3.4* 12/22/2014   NEUTROABS 2.3 12/22/2014   HGB 13.3 12/22/2014   HCT 39.6 12/22/2014   MCV 88.7 12/22/2014   PLT 229 12/22/2014      Chemistry      Component Value Date/Time   NA 141 12/22/2014 1055   NA 138 06/13/2010 1000   K 4.0 12/22/2014 1055   K 4.0 06/13/2010 1000   CL 100 06/13/2010 1000   CO2 29 12/22/2014 1055   CO2 30 06/13/2010 1000   BUN 17.0 12/22/2014 1055   BUN 4* 06/13/2010 1000   CREATININE 0.8 12/22/2014 1055   CREATININE 0.75 06/13/2010 1000      Component Value Date/Time   CALCIUM 9.2 12/22/2014 1055   CALCIUM 8.4 06/13/2010 1000   ALKPHOS 75 12/22/2014 1055   ALKPHOS 211* 06/13/2010 1000   AST 20 12/22/2014 1055   AST 109* 06/13/2010 1000   ALT 15 12/22/2014 1055   ALT 217* 06/13/2010 1000   BILITOT 0.84 12/22/2014 1055   BILITOT 0.8 06/13/2010 1000       RADIOGRAPHIC STUDIES: I reviewed the PET CT scan with him. I have personally reviewed the radiological images as listed and agreed with the findings in the report.   ASSESSMENT & PLAN:  Follicular lymphoma grade 3a We discussed the role of chemotherapy. The intent is for cure. The decision was made based on publication at the [facility]. It is a category 1 recommendation from NCCN. Even though he has follicular lymphoma, due to extensive disease and aggressive behavior of disease, I will recommend treatment with R-CHOP just like diffuse large B-cell lymphoma. CHOP Chemotherapy plus Rituximab Compared with CHOP Alone in Elderly Patients with Diffuse Large-B-Cell  Lymphoma Jannet Askew, M.D., Rexene Edison, M.D., Ph.D., Farley Ly, M.D., Crawford Givens, M.D., Knox Royalty, M.D., Ricky Ala, M.D., Doree Fudge, M.D., Eppie Gibson Richarda Blade, M.D., Simona Huh, M.D., Ph.D., Jolene Schimke, M.D., Bishop Limbo, M.D., Evelene Croon, Evelene Croon, Ph.D., and Sallyanne Kuster, M.D. Alison Stalling J Med 2002; 346:235-242January 24, 2002DOI: 10.1056/[id number]  The rate of complete response was significantly higher in the group that received CHOP plus rituximab than in the group that received CHOP alone (76 percent vs. 63 percent, P=0.005). With a median follow-up of two years, event-free and overall survival times were significantly higher in the CHOP-plus-rituximab group (P<0.001 and P=0.007, respectively). The addition of rituximab to standard CHOP chemotherapy significantly reduced the risk of treatment failure and death (risk ratios, 0.58 [95 percent confidence interval, 0.44 to 0.77] and 0.64 [0.45 to 0.89], respectively). Clinically relevant toxicity was not significantly greater with CHOP plus rituximab.  We discussed some  of the risks, benefits and side-effects of Rituximab,Cytoxan, Adriamycin,Vincristine and Solumedrol/Prednisone.   Some of the short term side-effects included, though not limited to, risk of fatigue, weight loss, tumor lysis syndrome, risk of allergic reactions, pancytopenia, life-threatening infections, need for transfusions of blood products, nausea, vomiting, change in bowel habits, hair loss, risk of congestive heart failure, admission to hospital for various reasons, and risks of death.   Long term side-effects are also discussed including permanent damage to nerve function, chronic fatigue, and rare secondary malignancy including bone marrow disorders.   The patient is aware that the response rates discussed earlier is not guaranteed.    After a long discussion, patient made an informed decision to proceed with the  prescribed plan of care.  Patient education material was dispensed He would need port placement prior to the start of treatment. Per patient request, we will delay treatment until next week. I will see him back prior to cycle 2 of treatment.    Orders Placed This Encounter  Procedures  . IR Fluoro Guide CV Line Right    Indicate type of CVC ordering    Standing Status: Future     Number of Occurrences:      Standing Expiration Date: 02/25/2016    Order Specific Question:  Reason for exam:    Answer:  port for chemo, please order for next Thursday    Order Specific Question:  Preferred Imaging Location?    Answer:  [facility]  . CBC with Differential    Standing Status: Standing     Number of Occurrences: 20     Standing Expiration Date: 12/27/2015  . Comprehensive metabolic panel    Standing Status: Standing     Number of Occurrences: 20     Standing Expiration Date: 12/27/2015  . Ambulatory referral to Social Work    Referral Priority:  Routine    Referral Type:  Consultation    Referral Reason:  Specialty Services Required    Number of Visits Requested:  1  . PHYSICIAN COMMUNICATION ORDER    A baseline Echo/ Muga should be obtained prior to initiation of Anthracycline Chemotherapy  . PHYSICIAN COMMUNICATION ORDER    Hepatitis B Virus screening with HBsAg and anti-HBc recommended prior to treatment with rituximab (Rituxan), ofatumumab (Arzerra) or obinutuzumab Dyann Kief).   All questions were answered. The patient knows to call the clinic with any problems, questions or concerns. No barriers to learning was detected. I spent 30 minutes counseling the patient face to face. The total time spent in the appointment was 40 minutes and more than 50% was on counseling and review of test results     [facility], [place], MD 12/27/2014 4:03 PM

## 2014-12-27 NOTE — Assessment & Plan Note (Signed)
We discussed the role of chemotherapy. The intent is for cure. The decision was made based on publication at the [facility]. It is a category 1 recommendation from NCCN. Even though he has follicular lymphoma, due to extensive disease and aggressive behavior of disease, I will recommend treatment with R-CHOP just like diffuse large B-cell lymphoma. CHOP Chemotherapy plus Rituximab Compared with CHOP Alone in Elderly Patients with Diffuse Large-B-Cell Lymphoma Jannet Askew, M.D., Rexene Edison, M.D., Ph.D., Farley Ly, M.D., Crawford Givens, M.D., Knox Royalty, M.D., Ricky Ala, M.D., Doree Fudge, M.D., Eppie Gibson Richarda Blade, M.D., Simona Huh, M.D., Ph.D., Jolene Schimke, M.D., Bishop Limbo, M.D., Evelene Croon, Evelene Croon, Ph.D., and Sallyanne Kuster, M.D. Alison Stalling J Med 2002; 346:235-242January 24, 2002DOI: 10.1056/[id number]  The rate of complete response was significantly higher in the group that received CHOP plus rituximab than in the group that received CHOP alone (76 percent vs. 63 percent, P=0.005). With a median follow-up of two years, event-free and overall survival times were significantly higher in the CHOP-plus-rituximab group (P<0.001 and P=0.007, respectively). The addition of rituximab to standard CHOP chemotherapy significantly reduced the risk of treatment failure and death (risk ratios, 0.58 [95 percent confidence interval, 0.44 to 0.77] and 0.64 [0.45 to 0.89], respectively). Clinically relevant toxicity was not significantly greater with CHOP plus rituximab.  We discussed some of the risks, benefits and side-effects of Rituximab,Cytoxan, Adriamycin,Vincristine and Solumedrol/Prednisone.   Some of the short term side-effects included, though not limited to, risk of fatigue, weight loss, tumor lysis syndrome, risk of allergic reactions, pancytopenia, life-threatening infections, need for transfusions of blood products, nausea, vomiting, change in bowel habits, hair  loss, risk of congestive heart failure, admission to hospital for various reasons, and risks of death.   Long term side-effects are also discussed including permanent damage to nerve function, chronic fatigue, and rare secondary malignancy including bone marrow disorders.   The patient is aware that the response rates discussed earlier is not guaranteed.    After a long discussion, patient made an informed decision to proceed with the prescribed plan of care.  Patient education material was dispensed He would need port placement prior to the start of treatment. Per patient request, we will delay treatment until next week. I will see him back prior to cycle 2 of treatment.

## 2014-12-28 ENCOUNTER — Encounter: Payer: Self-pay | Admitting: *Deleted

## 2014-12-28 NOTE — Progress Notes (Signed)
[place] Psychosocial Distress Screening Clinical Social Work  Clinical Social Work was referred by distress screening protocol.  The patient scored a 5 on the Psychosocial Distress Thermometer which indicates moderate distress. Clinical Social Worker contacted patient by phone to assess for distress and other psychosocial needs. Mr. Loescher shared he is not "excited" about chemotherapy, but knows it is his only option right now.  The patient shared his concern regarding insurance and food because he does not know if he will be able to work through treatment.  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  CSW encouraged patient to talk with MD about job limitations while in treatment.  CSW encouraged patient to contact CSW if he has financial concerns once he starts treatment.  ONCBCN DISTRESS SCREENING 12/26/2014  Distress experienced in past week (1-10) 5  Practical problem type Insurance;Work/school;Food  Emotional problem type Adjusting to illness    Clinical Social Worker follow up needed: No.  If yes, follow up plan:  Polo Riley, MSW, LCSW, OSW-C Clinical Social Worker [facility] [phone]

## 2015-01-03 ENCOUNTER — Other Ambulatory Visit: Payer: Self-pay | Admitting: Radiology

## 2015-01-04 ENCOUNTER — Other Ambulatory Visit: Payer: Self-pay | Admitting: *Deleted

## 2015-01-04 ENCOUNTER — Ambulatory Visit (HOSPITAL_COMMUNITY)
Admission: RE | Admit: 2015-01-04 | Discharge: 2015-01-04 | Disposition: A | Discharge status: Home / Self Care | Payer: 59 | Source: Ambulatory Visit | Attending: Hematology and Oncology | Admitting: Hematology and Oncology

## 2015-01-04 ENCOUNTER — Other Ambulatory Visit: Payer: Self-pay | Admitting: Hematology and Oncology

## 2015-01-04 ENCOUNTER — Encounter (HOSPITAL_COMMUNITY)
Admission: RE | Admit: 2015-01-04 | Discharge: 2015-01-04 | Disposition: A | Discharge status: Home / Self Care | Payer: 59 | Source: Ambulatory Visit | Attending: Hematology and Oncology | Admitting: Hematology and Oncology

## 2015-01-04 ENCOUNTER — Encounter (HOSPITAL_COMMUNITY): Payer: Self-pay

## 2015-01-04 DIAGNOSIS — C8291 Follicular lymphoma, unspecified, lymph nodes of head, face, and neck: Secondary | ICD-10-CM | POA: Insufficient documentation

## 2015-01-04 DIAGNOSIS — C8230 Follicular lymphoma grade IIIa, unspecified site: Secondary | ICD-10-CM

## 2015-01-04 HISTORY — DX: Respiratory tuberculosis unspecified: A15.9

## 2015-01-04 HISTORY — DX: Unspecified osteoarthritis, unspecified site: M19.90

## 2015-01-04 LAB — APTT: aPTT: 32 seconds (ref 24–37)

## 2015-01-04 LAB — CBC WITH DIFFERENTIAL/PLATELET
BASOS ABS: 0 10*3/uL (ref 0.0–0.1)
Basophils Relative: 0 % (ref 0–1)
Eosinophils Absolute: 0.2 10*3/uL (ref 0.0–0.7)
Eosinophils Relative: 6 % — ABNORMAL HIGH (ref 0–5)
HEMATOCRIT: 42.3 % (ref 39.0–52.0)
Hemoglobin: 14.2 g/dL (ref 13.0–17.0)
LYMPHS ABS: 0.6 10*3/uL — AB (ref 0.7–4.0)
LYMPHS PCT: 16 % (ref 12–46)
MCH: 29.7 pg (ref 26.0–34.0)
MCHC: 33.6 g/dL (ref 30.0–36.0)
MCV: 88.5 fL (ref 78.0–100.0)
MONO ABS: 0.3 10*3/uL (ref 0.1–1.0)
Monocytes Relative: 8 % (ref 3–12)
NEUTROS ABS: 2.7 10*3/uL (ref 1.7–7.7)
Neutrophils Relative %: 70 % (ref 43–77)
PLATELETS: 231 10*3/uL (ref 150–400)
RBC: 4.78 MIL/uL (ref 4.22–5.81)
RDW: 13.4 % (ref 11.5–15.5)
WBC: 3.9 10*3/uL — AB (ref 4.0–10.5)

## 2015-01-04 LAB — PROTIME-INR
INR: 1.09 (ref 0.00–1.49)
Prothrombin Time: 14.3 seconds (ref 11.6–15.2)

## 2015-01-04 MED ORDER — LIDOCAINE HCL 1 % IJ SOLN
INTRAMUSCULAR | Status: AC
  Filled 2015-01-04: qty 20

## 2015-01-04 MED ORDER — HEPARIN SOD (PORK) LOCK FLUSH 100 UNIT/ML IV SOLN
INTRAVENOUS | Status: AC
  Filled 2015-01-04: qty 5

## 2015-01-04 MED ORDER — FENTANYL CITRATE (PF) 100 MCG/2ML IJ SOLN
INTRAMUSCULAR | Status: AC
  Filled 2015-01-04: qty 4

## 2015-01-04 MED ORDER — LIDOCAINE-EPINEPHRINE 2 %-1:100000 IJ SOLN
INTRAMUSCULAR | Status: AC
  Filled 2015-01-04: qty 1

## 2015-01-04 MED ORDER — CEFAZOLIN SODIUM-DEXTROSE 2-3 GM-% IV SOLR
2.0000 g | INTRAVENOUS | Status: AC
  Administered 2015-01-04: 2 g via INTRAVENOUS

## 2015-01-04 MED ORDER — FENTANYL CITRATE (PF) 100 MCG/2ML IJ SOLN
INTRAMUSCULAR | Status: AC | PRN
  Administered 2015-01-04: 50 ug via INTRAVENOUS

## 2015-01-04 MED ORDER — CEFAZOLIN SODIUM-DEXTROSE 2-3 GM-% IV SOLR
INTRAVENOUS | Status: AC
  Filled 2015-01-04: qty 50

## 2015-01-04 MED ORDER — MIDAZOLAM HCL 2 MG/2ML IJ SOLN
INTRAMUSCULAR | Status: AC
  Filled 2015-01-04: qty 6

## 2015-01-04 MED ORDER — MIDAZOLAM HCL 2 MG/2ML IJ SOLN
INTRAMUSCULAR | Status: AC | PRN
  Administered 2015-01-04 (×2): 1 mg via INTRAVENOUS

## 2015-01-04 MED ORDER — SODIUM CHLORIDE 0.9 % IV SOLN
INTRAVENOUS | Status: DC
  Administered 2015-01-04: 09:00:00 via INTRAVENOUS

## 2015-01-04 NOTE — Discharge Instructions (Signed)
Implanted Port Insertion, Care After °Refer to this sheet in the next few weeks. These instructions provide you with information on caring for yourself after your procedure. Your health care provider may also give you more specific instructions. Your treatment has been planned according to current medical practices, but problems sometimes occur. Call your health care provider if you have any problems or questions after your procedure. °WHAT TO EXPECT AFTER THE PROCEDURE °After your procedure, it is typical to have the following:  °· Discomfort at the port insertion site. Ice packs to the area will help. °· Bruising on the skin over the port. This will subside in 3-4 days. °HOME CARE INSTRUCTIONS °· After your port is placed, you will get a manufacturer's information card. The card has information about your port. Keep this card with you at all times.   °· Know what kind of port you have. There are many types of ports available.   °· Wear a medical alert bracelet in case of an emergency. This can help alert health care workers that you have a port.   °· The port can stay in for as long as your health care provider believes it is necessary.   °· A home health care nurse may give medicines and take care of the port.   °· You or a family member can get special training and directions for giving medicine and taking care of the port at home.   °SEEK MEDICAL CARE IF:  °· Your port does not flush or you are unable to get a blood return.   °· You have a fever or chills. °SEEK IMMEDIATE MEDICAL CARE IF: °· You have new fluid or pus coming from your incision.   °· You notice a bad smell coming from your incision site.   °· You have swelling, pain, or more redness at the incision or port site.   °· You have chest pain or shortness of breath. °Document Released: 04/06/2013 Document Revised: 06/21/2013 Document Reviewed: 04/06/2013 °ExitCare® Patient Information ©2015 [employer]. This information is not intended to replace  advice given to you by your health care provider. Make sure you discuss any questions you have with your health care provider. °Implanted Port Home Guide °An implanted port is a type of central line that is placed under the skin. Central lines are used to provide IV access when treatment or nutrition needs to be given through a person's veins. Implanted ports are used for long-term IV access. An implanted port may be placed because:  °· You need IV medicine that would be irritating to the small veins in your hands or arms.   °· You need long-term IV medicines, such as antibiotics.   °· You need IV nutrition for a long period.   °· You need frequent blood draws for lab tests.   °· You need dialysis.   °Implanted ports are usually placed in the chest area, but they can also be placed in the upper arm, the abdomen, or the leg. An implanted port has two main parts:  °· Reservoir. The reservoir is round and will appear as a small, raised area under your skin. The reservoir is the part where a needle is inserted to give medicines or draw blood.   °· Catheter. The catheter is a thin, flexible tube that extends from the reservoir. The catheter is placed into a large vein. Medicine that is inserted into the reservoir goes into the catheter and then into the vein.   °HOW WILL I CARE FOR MY INCISION SITE? °Do not get the incision site wet. Bathe or   shower as directed by your health care provider.  °HOW IS MY PORT ACCESSED? °Special steps must be taken to access the port:  °· Before the port is accessed, a numbing cream can be placed on the skin. This helps numb the skin over the port site.   °· Your health care provider uses a sterile technique to access the port. °· Your health care provider must put on a mask and sterile gloves. °· The skin over your port is cleaned carefully with an antiseptic and allowed to dry. °· The port is gently pinched between sterile gloves, and a needle is inserted into the port. °· Only  "non-coring" port needles should be used to access the port. Once the port is accessed, a blood return should be checked. This helps ensure that the port is in the vein and is not clogged.   °· If your port needs to remain accessed for a constant infusion, a clear (transparent) bandage will be placed over the needle site. The bandage and needle will need to be changed every week, or as directed by your health care provider.   °· Keep the bandage covering the needle clean and dry. Do not get it wet. Follow your health care provider's instructions on how to take a shower or bath while the port is accessed.   °· If your port does not need to stay accessed, no bandage is needed over the port.   °WHAT IS FLUSHING? °Flushing helps keep the port from getting clogged. Follow your health care provider's instructions on how and when to flush the port. Ports are usually flushed with saline solution or a medicine called heparin. The need for flushing will depend on how the port is used.  °· If the port is used for intermittent medicines or blood draws, the port will need to be flushed:   °· After medicines have been given.   °· After blood has been drawn.   °· As part of routine maintenance.   °· If a constant infusion is running, the port may not need to be flushed.   °HOW LONG WILL MY PORT STAY IMPLANTED? °The port can stay in for as long as your health care provider thinks it is needed. When it is time for the port to come out, surgery will be done to remove it. The procedure is similar to the one performed when the port was put in.  °WHEN SHOULD I SEEK IMMEDIATE MEDICAL CARE? °When you have an implanted port, you should seek immediate medical care if:  °· You notice a bad smell coming from the incision site.   °· You have swelling, redness, or drainage at the incision site.   °· You have more swelling or pain at the port site or the surrounding area.   °· You have a fever that is not controlled with medicine. °Document  Released: 06/16/2005 Document Revised: 04/06/2013 Document Reviewed: 02/21/2013 °ExitCare® Patient Information ©2015 [employer]. This information is not intended to replace advice given to you by your health care provider. Make sure you discuss any questions you have with your health care provider.Conscious Sedation, Adult, Care After °Refer to this sheet in the next few weeks. These instructions provide you with information on caring for yourself after your procedure. Your health care provider may also give you more specific instructions. Your treatment has been planned according to current medical practices, but problems sometimes occur. Call your health care provider if you have any problems or questions after your procedure. °WHAT TO EXPECT AFTER THE PROCEDURE  °After your procedure: °·   You may feel sleepy, clumsy, and have poor balance for several hours. °· Vomiting may occur if you eat too soon after the procedure. °HOME CARE INSTRUCTIONS °· Do not participate in any activities where you could become injured for at least 24 hours. Do not: °¨ Drive. °¨ Swim. °¨ Ride a bicycle. °¨ Operate heavy machinery. °¨ Cook. °¨ Use power tools. °¨ Climb ladders. °¨ Work from a high place. °· Do not make important decisions or sign legal documents until you are improved. °· If you vomit, drink water, juice, or soup when you can drink without vomiting. Make sure you have little or no nausea before eating solid foods. °· Only take over-the-counter or prescription medicines for pain, discomfort, or fever as directed by your health care provider. °· Make sure you and your family fully understand everything about the medicines given to you, including what side effects may occur. °· You should not drink alcohol, take sleeping pills, or take medicines that cause drowsiness for at least 24 hours. °· If you smoke, do not smoke without supervision. °· If you are feeling better, you may resume normal activities 24 hours after you  were sedated. °· Keep all appointments with your health care provider. °SEEK MEDICAL CARE IF: °· Your skin is pale or bluish in color. °· You continue to feel nauseous or vomit. °· Your pain is getting worse and is not helped by medicine. °· You have bleeding or swelling. °· You are still sleepy or feeling clumsy after 24 hours. °SEEK IMMEDIATE MEDICAL CARE IF: °· You develop a rash. °· You have difficulty breathing. °· You develop any type of allergic problem. °· You have a fever. °MAKE SURE YOU: °· Understand these instructions. °· Will watch your condition. °· Will get help right away if you are not doing well or get worse. °Document Released: 04/06/2013 Document Reviewed: 04/06/2013 °ExitCare® Patient Information ©2015 [employer]. This information is not intended to replace advice given to you by your health care provider. Make sure you discuss any questions you have with your health care provider. ° °

## 2015-01-04 NOTE — Procedures (Signed)
Successful RT IJ POWER PORT INSERTION NO COMP STABLE READY FOR USE FULL REPORT IN PACS

## 2015-01-04 NOTE — H&P (Signed)
Chief Complaint: "I'm here for a port a cath"  Referring Physician(s): Gorsuch,Ni  History of Present Illness: Travis Burton is a 59 y.o. male with history of grade 3A follicular lymphoma who presents today for port a cath placement for chemotherapy.   Past Medical History  Diagnosis Date  . Neck mass     left  . Cancer     lymphoma  . Tuberculosis     treatment as a young person   . Arthritis     both shoulders     Past Surgical History  Procedure Laterality Date  . Cholecystectomy    . Vasectomy    . Mass biopsy Left 11/28/2014    Procedure: OPEN  BIOPSY LEFT NECK MASS;  Surgeon: Melissa Montane, MD;  Location: [facility];  Service: ENT;  Laterality: Left;  Marland Kitchen Vasectomy reversal  2004    Allergies: Review of patient's allergies indicates no known allergies.  Medications: Prior to Admission medications   Medication Sig Start Date End Date Taking? Authorizing Provider  allopurinol (ZYLOPRIM) 300 MG tablet Take 1 tablet (300 mg total) by mouth daily. 12/26/14  Yes Heath Lark, MD  lidocaine-prilocaine (EMLA) cream Apply to affected area once 12/26/14   Heath Lark, MD  ondansetron (ZOFRAN) 8 MG tablet Take 1 tablet (8 mg total) by mouth every 8 (eight) hours as needed. 12/26/14   Heath Lark, MD  predniSONE (DELTASONE) 20 MG tablet Take 3 tablets (60 mg total) by mouth daily with breakfast. Take on days 2-5 of chemotherapy. 12/26/14   Heath Lark, MD  prochlorperazine (COMPAZINE) 10 MG tablet Take 1 tablet (10 mg total) by mouth every 6 (six) hours as needed (Nausea or vomiting). 12/26/14   Heath Lark, MD     Family History  Problem Relation Age of Onset  . Cancer Mother 86    breast ca    History   Social History  . Marital Status: Married    Spouse Name: N/A  . Number of Children: N/A  . Years of Education: N/A   Social History Main Topics  . Smoking status: Never Smoker   . Smokeless tobacco: Never Used  . Alcohol Use: Yes     Comment: social    . Drug Use: No  . Sexual Activity: Yes   Other Topics Concern  . Not on file   Social History Narrative      Review of Systems  Constitutional: Negative for fever and chills.  Respiratory: Negative for cough and shortness of breath.   Cardiovascular: Negative for chest pain.  Gastrointestinal: Negative for nausea, vomiting, abdominal pain and blood in stool.  Genitourinary: Negative for dysuria and hematuria.  Musculoskeletal: Positive for back pain.  Skin: Positive for rash.       Poison ivy rash both UE's/ left lat chest   Neurological: Negative for headaches.    Vital Signs: BP 136/70  HR 87  R 18  TEMP 97.4  O2 SATS 100% RA  Physical Exam  Constitutional: He is oriented to person, place, and time. He appears well-developed and well-nourished.  Neck:  Left supraclavicular adenopathy  Cardiovascular: Normal rate and regular rhythm.   Pulmonary/Chest: Effort normal and breath sounds normal.  Abdominal: Soft. Bowel sounds are normal.  Musculoskeletal: Normal range of motion. He exhibits no edema.  Neurological: He is alert and oriented to person, place, and time.  Skin: Rash noted.  Poison ivy both UE's/left lat chest    Mallampati Score:  Imaging: Nm Pet Image Initial (pi) Skull Base To Thigh  12/22/2014   CLINICAL DATA:  Initial treatment strategy for follicular lymphoma, grade 3. Staging. Neck mass.  EXAM: NUCLEAR MEDICINE PET SKULL BASE TO THIGH  TECHNIQUE: Head 7.6 MCi F-18 FDG was injected intravenously. Full-ring PET imaging was performed from the skull base to thigh after the radiotracer. CT data was obtained and used for attenuation correction and anatomic localization.  FASTING BLOOD GLUCOSE:  Value: 75 mg/dl  COMPARISON:  Abdominal pelvic CT of 06/08/2010  FINDINGS: NECK  Low left neck mass which extends into the upper chest. Difficult to differentiate from the surrounding sternocleidomastoid musculature. Measures on the order of 4.2 x 4.8 cm and a S.U.V.  max of 19.9 on image 40 of series 5.  Immediately cephalad left posterior triangle nodes measure up to 1.3 cm and a S.U.V. max of 13.1.  CHEST  Extension of above dominant mass into the upper left chest. No separate areas of hypermetabolism within the chest.  ABDOMEN/PELVIS  Confluent retroperitoneal and mesenteric adenopathy. A mantle of preaortic nodal tissue measures 3.5 cm and a S.U.V. max of 13.2 on image 113.  Mesenteric adenopathy which measures on the order of 1.6 cm and a S.U.V. max of 8.5 on image 130.  No hypermetabolic pelvic nodes.  SKELETON  No abnormal marrow activity.  CT IMAGES PERFORMED FOR ATTENUATION CORRECTION  No further findings within the neck. No pulmonary nodule or mass. No pericardial or pleural effusion. Normal adrenal glands. No hydronephrosis. Cholecystectomy. No bowel obstruction or other acute complication. Mild prostatomegaly.  IMPRESSION: Active lymphoma within the neck, upper chest, and abdomen as detailed above.   Electronically Signed   By: Abigail Miyamoto M.D.   On: 12/22/2014 16:18    Labs:  CBC:  Recent Labs  11/28/14 0935 12/22/14 1055 01/04/15 0845  WBC  --  3.4* 3.9*  HGB 14.8 13.3 14.2  HCT  --  39.6 42.3  PLT  --  229 231    COAGS: No results for input(s): INR, APTT in the last 8760 hours.  BMP:  Recent Labs  12/22/14 1055  NA 141  K 4.0  CO2 29  GLUCOSE 78  BUN 17.0  CALCIUM 9.2  CREATININE 0.8    LIVER FUNCTION TESTS:  Recent Labs  12/22/14 1055  BILITOT 0.84  AST 20  ALT 15  ALKPHOS 75  PROT 6.5  ALBUMIN 4.4    TUMOR MARKERS: No results for input(s): AFPTM, CEA, CA199, CHROMGRNA in the last 8760 hours.  Assessment and Plan: Travis Burton is a 59 y.o. male with history of grade 3A follicular lymphoma who presents today for port a cath placement for chemotherapy. Risks and benefits discussed with the patient/wife including, but not limited to bleeding, infection, pneumothorax, or fibrin sheath development and need for  additional procedures.All of the patient's questions were answered, patient is agreeable to proceed.Consent signed and in chart.      Signed: D. Rowe Robert 01/04/2015, 9:07 AM   I spent a total of 30 minutes in face to face in clinical consultation, greater than 50% of which was counseling/coordinating care for port a cath placement

## 2015-01-05 ENCOUNTER — Ambulatory Visit (HOSPITAL_BASED_OUTPATIENT_CLINIC_OR_DEPARTMENT_OTHER): Payer: 59

## 2015-01-05 VITALS — BP 120/63 | HR 69 | Temp 97.4°F | Resp 18

## 2015-01-05 DIAGNOSIS — Z5112 Encounter for antineoplastic immunotherapy: Secondary | ICD-10-CM | POA: Diagnosis not present

## 2015-01-05 DIAGNOSIS — C8230 Follicular lymphoma grade IIIa, unspecified site: Secondary | ICD-10-CM

## 2015-01-05 DIAGNOSIS — Z5111 Encounter for antineoplastic chemotherapy: Secondary | ICD-10-CM | POA: Diagnosis not present

## 2015-01-05 DIAGNOSIS — Z5189 Encounter for other specified aftercare: Secondary | ICD-10-CM

## 2015-01-05 LAB — CHROMOSOME ANALYSIS, BONE MARROW

## 2015-01-05 LAB — TISSUE HYBRIDIZATION (BONE MARROW)-NCBH

## 2015-01-05 MED ORDER — SODIUM CHLORIDE 0.9 % IV SOLN
375.0000 mg/m2 | Freq: Once | INTRAVENOUS | Status: AC
  Administered 2015-01-05: 700 mg via INTRAVENOUS
  Filled 2015-01-05: qty 70

## 2015-01-05 MED ORDER — PEGFILGRASTIM 6 MG/0.6ML ~~LOC~~ PSKT
6.0000 mg | PREFILLED_SYRINGE | Freq: Once | SUBCUTANEOUS | Status: AC
  Administered 2015-01-05: 6 mg via SUBCUTANEOUS
  Filled 2015-01-05: qty 0.6

## 2015-01-05 MED ORDER — SODIUM CHLORIDE 0.9 % IV SOLN
Freq: Once | INTRAVENOUS | Status: AC
  Administered 2015-01-05: 10:00:00 via INTRAVENOUS

## 2015-01-05 MED ORDER — SODIUM CHLORIDE 0.9 % IV SOLN
750.0000 mg/m2 | Freq: Once | INTRAVENOUS | Status: AC
  Administered 2015-01-05: 1340 mg via INTRAVENOUS
  Filled 2015-01-05: qty 67

## 2015-01-05 MED ORDER — VINCRISTINE SULFATE CHEMO INJECTION 1 MG/ML
2.0000 mg | Freq: Once | INTRAVENOUS | Status: AC
  Administered 2015-01-05: 2 mg via INTRAVENOUS
  Filled 2015-01-05: qty 2

## 2015-01-05 MED ORDER — ACETAMINOPHEN 325 MG PO TABS
ORAL_TABLET | ORAL | Status: AC
  Filled 2015-01-05: qty 2

## 2015-01-05 MED ORDER — DIPHENHYDRAMINE HCL 25 MG PO CAPS
50.0000 mg | ORAL_CAPSULE | Freq: Once | ORAL | Status: AC
  Administered 2015-01-05: 50 mg via ORAL

## 2015-01-05 MED ORDER — HEPARIN SOD (PORK) LOCK FLUSH 100 UNIT/ML IV SOLN
500.0000 [IU] | Freq: Once | INTRAVENOUS | Status: AC | PRN
  Administered 2015-01-05: 500 [IU]
  Filled 2015-01-05: qty 5

## 2015-01-05 MED ORDER — DIPHENHYDRAMINE HCL 25 MG PO CAPS
ORAL_CAPSULE | ORAL | Status: AC
  Filled 2015-01-05: qty 2

## 2015-01-05 MED ORDER — SODIUM CHLORIDE 0.9 % IJ SOLN
10.0000 mL | INTRAMUSCULAR | Status: DC | PRN
  Administered 2015-01-05: 10 mL
  Filled 2015-01-05: qty 10

## 2015-01-05 MED ORDER — DOXORUBICIN HCL CHEMO IV INJECTION 2 MG/ML
50.0000 mg/m2 | Freq: Once | INTRAVENOUS | Status: AC
  Administered 2015-01-05: 90 mg via INTRAVENOUS
  Filled 2015-01-05: qty 45

## 2015-01-05 MED ORDER — ACETAMINOPHEN 325 MG PO TABS
650.0000 mg | ORAL_TABLET | Freq: Once | ORAL | Status: AC
  Administered 2015-01-05: 650 mg via ORAL

## 2015-01-05 MED ORDER — DEXAMETHASONE SODIUM PHOSPHATE 100 MG/10ML IJ SOLN
Freq: Once | INTRAMUSCULAR | Status: AC
  Administered 2015-01-05: 10:00:00 via INTRAVENOUS
  Filled 2015-01-05: qty 8

## 2015-01-05 NOTE — Patient Instructions (Addendum)
[facility] Discharge Instructions for Patients Receiving Chemotherapy  Today you received the following chemotherapy agents :  Adriamycin, Vincristine, Cytoxan, Rituxan.  To help prevent nausea and vomiting after your treatment, we encourage you to take your nausea medication as prescribed.  Take Prednisone 60 mg daily  as instructed on day 2 - 5;  Take  Zofran 8mg  by mouth every 8 hours as needed for nausea;  Can also take Compazine 10mg  by mouth every 6 hours as needed for nausea.  DO NOT Drive after taking Compazine as it can cause drowsiness. DO NOT Eat  Greasy  Nor  Spicy  Foods.   DO Drink  Lots  Of  Fluids  As tolerated.   If you develop nausea and vomiting that is not controlled by your nausea medication, call the clinic.   BELOW ARE SYMPTOMS THAT SHOULD BE REPORTED IMMEDIATELY:  *FEVER GREATER THAN 100.5 F  *CHILLS WITH OR WITHOUT FEVER  NAUSEA AND VOMITING THAT IS NOT CONTROLLED WITH YOUR NAUSEA MEDICATION  *UNUSUAL SHORTNESS OF BREATH  *UNUSUAL BRUISING OR BLEEDING  TENDERNESS IN MOUTH AND THROAT WITH OR WITHOUT PRESENCE OF ULCERS  *URINARY PROBLEMS  *BOWEL PROBLEMS  UNUSUAL RASH Items with * indicate a potential emergency and should be followed up as soon as possible.  Feel free to call the clinic you have any questions or concerns. The clinic phone number is [phone].  Please show the [facility] at check-in to the Emergency Department and triage nurse.  Pegfilgrastim injection What is this medicine? PEGFILGRASTIM (peg fil GRA stim) is a long-acting granulocyte colony-stimulating factor that stimulates the growth of neutrophils, a type of white blood cell important in the body's fight against infection. It is used to reduce the incidence of fever and infection in patients with certain types of cancer who are receiving chemotherapy that affects the bone marrow. This medicine may be used for other purposes; ask your health care provider or  pharmacist if you have questions. COMMON BRAND NAME(S): Neulasta What should I tell my health care provider before I take this medicine? They need to know if you have any of these conditions: -latex allergy -ongoing radiation therapy -sickle cell disease -skin reactions to acrylic adhesives (On-Body Injector only) -an unusual or allergic reaction to pegfilgrastim, filgrastim, other medicines, foods, dyes, or preservatives -pregnant or trying to get pregnant -breast-feeding How should I use this medicine? This medicine is for injection under the skin. If you get this medicine at home, you will be taught how to prepare and give the pre-filled syringe or how to use the On-body Injector. Refer to the patient Instructions for Use for detailed instructions. Use exactly as directed. Take your medicine at regular intervals. Do not take your medicine more often than directed. It is important that you put your used needles and syringes in a special sharps container. Do not put them in a trash can. If you do not have a sharps container, call your pharmacist or healthcare provider to get one. Talk to your pediatrician regarding the use of this medicine in children. Special care may be needed. Overdosage: If you think you have taken too much of this medicine contact a poison control center or emergency room at once. NOTE: This medicine is only for you. Do not share this medicine with others. What if I miss a dose? It is important not to miss your dose. Call your doctor or health care professional if you miss your dose. If you miss  a dose due to an On-body Injector failure or leakage, a new dose should be administered as soon as possible using a single prefilled syringe for manual use. What may interact with this medicine? Interactions have not been studied. Give your health care provider a list of all the medicines, herbs, non-prescription drugs, or dietary supplements you use. Also tell them if you smoke,  drink alcohol, or use illegal drugs. Some items may interact with your medicine. This list may not describe all possible interactions. Give your health care provider a list of all the medicines, herbs, non-prescription drugs, or dietary supplements you use. Also tell them if you smoke, drink alcohol, or use illegal drugs. Some items may interact with your medicine. What should I watch for while using this medicine? You may need blood work done while you are taking this medicine. If you are going to need a MRI, CT scan, or other procedure, tell your doctor that you are using this medicine (On-Body Injector only). What side effects may I notice from receiving this medicine? Side effects that you should report to your doctor or health care professional as soon as possible: -allergic reactions like skin rash, itching or hives, swelling of the face, lips, or tongue -dizziness -fever -pain, redness, or irritation at site where injected -pinpoint red spots on the skin -shortness of breath or breathing problems -stomach or side pain, or pain at the shoulder -swelling -tiredness -trouble passing urine Side effects that usually do not require medical attention (report to your doctor or health care professional if they continue or are bothersome): -bone pain -muscle pain This list may not describe all possible side effects. Call your doctor for medical advice about side effects. You may report side effects to FDA at 1-800-FDA-1088. Where should I keep my medicine? Keep out of the reach of children. Store pre-filled syringes in a refrigerator between 2 and 8 degrees C (36 and 46 degrees F). Do not freeze. Keep in carton to protect from light. Throw away this medicine if it is left out of the refrigerator for more than 48 hours. Throw away any unused medicine after the expiration date. NOTE: This sheet is a summary. It may not cover all possible information. If you have questions about this medicine, talk  to your doctor, pharmacist, or health care provider.  2015, Elsevier/Gold Standard. (2013-09-15 16:14:05)

## 2015-01-08 ENCOUNTER — Telehealth: Payer: Self-pay | Admitting: *Deleted

## 2015-01-08 NOTE — Telephone Encounter (Signed)
Pt came into clinic today and I s/w him in lobby.  He requests letter excusing him from work for the next few months while he undergoes treatment.  He states his job is labor intensive.  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  He is worried about risk of infection and also being unable to perform due to fatigue from treatment.

## 2015-01-09 ENCOUNTER — Telehealth: Payer: Self-pay

## 2015-01-09 ENCOUNTER — Telehealth: Payer: Self-pay | Admitting: *Deleted

## 2015-01-09 ENCOUNTER — Encounter: Payer: Self-pay | Admitting: *Deleted

## 2015-01-09 NOTE — Telephone Encounter (Signed)
Informed pt of letter for work is ready to pick up at front desk.  He verbalized understanding.

## 2015-01-09 NOTE — Telephone Encounter (Signed)
Length of treatment 6 months  Ok to be out of work due to anticipated fatigue, nausea/vomiting and severe risks of infections

## 2015-01-09 NOTE — Telephone Encounter (Signed)
S/w Cameo and she had talked with pt re: chemo side effects on 7/11

## 2015-01-09 NOTE — Telephone Encounter (Signed)
-----   Message from Jarvis Morgan, RN sent at 01/05/2015  3:10 PM EDT ----- Regarding: Chemo follow up First time R-[facility] with onpro, Dr. Cletus Gash, TB, RN.

## 2015-01-10 ENCOUNTER — Encounter (HOSPITAL_COMMUNITY): Payer: Self-pay

## 2015-01-15 ENCOUNTER — Encounter (HOSPITAL_COMMUNITY): Payer: Self-pay

## 2015-01-17 ENCOUNTER — Encounter: Payer: Self-pay | Admitting: Hematology and Oncology

## 2015-01-17 ENCOUNTER — Telehealth: Payer: Self-pay | Admitting: *Deleted

## 2015-01-17 NOTE — Telephone Encounter (Signed)
Pt sent mychart message today reporting some hives.  I called pt and he reports some "pimple" like rash in his arms and belly started a few days ago and getting worse.  It is itchy.   Pt also asks if Dr. Alvy Bimler will prescribe him Vitamin C injections?    Per Dr. Alvy Bimler she will see pt in office tomorrow at 1 pm.  Take benadryl 25 mg every 6 hrs until then and take a picture of the rash today.    She will discuss the request for Vitamin C injection on tomorrow's visit.   Pt verbalized understanding.

## 2015-01-18 ENCOUNTER — Ambulatory Visit (HOSPITAL_BASED_OUTPATIENT_CLINIC_OR_DEPARTMENT_OTHER): Payer: 59 | Admitting: Hematology and Oncology

## 2015-01-18 ENCOUNTER — Encounter: Payer: Self-pay | Admitting: Hematology and Oncology

## 2015-01-18 VITALS — BP 114/68 | HR 80 | Temp 98.0°F | Resp 18 | Ht 67.0 in | Wt 137.1 lb

## 2015-01-18 DIAGNOSIS — C8230 Follicular lymphoma grade IIIa, unspecified site: Secondary | ICD-10-CM

## 2015-01-18 DIAGNOSIS — L270 Generalized skin eruption due to drugs and medicaments taken internally: Secondary | ICD-10-CM

## 2015-01-18 NOTE — Progress Notes (Signed)
[facility] OFFICE PROGRESS NOTE  Patient Care Team: Leighton Ruff, MD as PCP - General (Family Medicine) Melissa Montane, MD as Consulting Physician (Otolaryngology) Heath Lark, MD as Consulting Physician (Hematology and Oncology)  SUMMARY OF ONCOLOGIC HISTORY: Oncology History   Follicular lymphoma grade 3a   Staging form: Lymphoid Neoplasms, AJCC 6th Edition     Clinical stage from 12/27/2014: Stage III - Signed by Heath Lark, MD on 12/31/5007        Follicular lymphoma grade 3a   10/31/2014 Pathology Results Accession: [id number] FNA showed atypical lymphoid population   11/28/2014 Pathology Results Accession: [id number] Biopsy showed high grade follicular lymphoma   02/06/1750 Surgery He had open biopsy of left neck mass   12/22/2014 Imaging [facility] showed normal EF   12/22/2014 Imaging PET scan showed stage III disease   12/22/2014 Bone Marrow Biopsy Bone marrow biopsy was negative   01/05/2015 -  Chemotherapy He received cycle one of [facility] chemotherapy    INTERVAL HISTORY: Please see below for problem oriented charting. He is seen urgently because of diffuse skin rash. He started about a week ago, all over his body and is itchy. There were no ulceration on the skin He tolerated treatment well without any other side effects  REVIEW OF SYSTEMS:   Constitutional: Denies fevers, chills or abnormal weight loss Eyes: Denies blurriness of vision Ears, nose, mouth, throat, and face: Denies mucositis or sore throat Respiratory: Denies cough, dyspnea or wheezes Cardiovascular: Denies palpitation, chest discomfort or lower extremity swelling Gastrointestinal:  Denies nausea, heartburn or change in bowel habits Lymphatics: Denies new lymphadenopathy or easy bruising Neurological:Denies numbness, tingling or new weaknesses Behavioral/Psych: Mood is stable, no new changes  All other systems were reviewed with the patient and are negative.  I have reviewed the past medical  history, past surgical history, social history and family history with the patient and they are unchanged from previous note.  ALLERGIES:  is allergic to allopurinol.  MEDICATIONS:  Current Outpatient Prescriptions  Medication Sig Dispense Refill  . lidocaine-prilocaine (EMLA) cream Apply to affected area once 30 g 3  . ondansetron (ZOFRAN) 8 MG tablet Take 1 tablet (8 mg total) by mouth every 8 (eight) hours as needed. 30 tablet 1  . predniSONE (DELTASONE) 20 MG tablet Take 3 tablets (60 mg total) by mouth daily with breakfast. Take on days 2-5 of chemotherapy. 12 tablet 6  . prochlorperazine (COMPAZINE) 10 MG tablet Take 1 tablet (10 mg total) by mouth every 6 (six) hours as needed (Nausea or vomiting). 30 tablet 6   No current facility-administered medications for this visit.    PHYSICAL EXAMINATION: ECOG PERFORMANCE STATUS: 0 - Asymptomatic  Filed Vitals:   01/18/15 1310  BP: 114/68  Pulse: 80  Temp: 98 F (36.7 C)  Resp: 18   Filed Weights   01/18/15 1310  Weight: 137 lb 1.6 oz (62.188 kg)    GENERAL:alert, no distress and comfortable SKIN: He has diffuse maculopapular rash resemble drug allergy, likely from allopurinol EYES: normal, Conjunctiva are pink and non-injected, sclera clear OROPHARYNX:no exudate, no erythema and lips, buccal mucosa, and tongue normal  NECK: supple, thyroid normal size, non-tender, without nodularity LYMPH:  There is a palpable lymphadenopathy has regressed.  LUNGS: clear to auscultation and percussion with normal breathing effort HEART: regular rate & rhythm and no murmurs and no lower extremity edema ABDOMEN:abdomen soft, non-tender and normal bowel sounds Musculoskeletal:no cyanosis of digits and no clubbing  NEURO: alert & oriented x  3 with fluent speech, no focal motor/sensory deficits  LABORATORY DATA:  I have reviewed the data as listed    Component Value Date/Time   NA 141 12/22/2014 1055   NA 138 06/13/2010 1000   K 4.0  12/22/2014 1055   K 4.0 06/13/2010 1000   CL 100 06/13/2010 1000   CO2 29 12/22/2014 1055   CO2 30 06/13/2010 1000   GLUCOSE 78 12/22/2014 1055   GLUCOSE 126* 06/13/2010 1000   BUN 17.0 12/22/2014 1055   BUN 4* 06/13/2010 1000   CREATININE 0.8 12/22/2014 1055   CREATININE 0.75 06/13/2010 1000   CALCIUM 9.2 12/22/2014 1055   CALCIUM 8.4 06/13/2010 1000   PROT 6.5 12/22/2014 1055   PROT 5.9* 06/13/2010 1000   ALBUMIN 4.4 12/22/2014 1055   ALBUMIN 2.8* 06/13/2010 1000   AST 20 12/22/2014 1055   AST 109* 06/13/2010 1000   ALT 15 12/22/2014 1055   ALT 217* 06/13/2010 1000   ALKPHOS 75 12/22/2014 1055   ALKPHOS 211* 06/13/2010 1000   BILITOT 0.84 12/22/2014 1055   BILITOT 0.8 06/13/2010 1000   GFRNONAA >60 06/13/2010 1000   GFRAA  06/13/2010 1000    >60        The eGFR has been calculated using the MDRD equation. This calculation has not been validated in all clinical situations. eGFR's persistently <60 mL/min signify possible Chronic Kidney Disease.    No results found for: SPEP, UPEP  Lab Results  Component Value Date   WBC 3.9* 01/04/2015   NEUTROABS 2.7 01/04/2015   HGB 14.2 01/04/2015   HCT 42.3 01/04/2015   MCV 88.5 01/04/2015   PLT 231 01/04/2015      Chemistry      Component Value Date/Time   NA 141 12/22/2014 1055   NA 138 06/13/2010 1000   K 4.0 12/22/2014 1055   K 4.0 06/13/2010 1000   CL 100 06/13/2010 1000   CO2 29 12/22/2014 1055   CO2 30 06/13/2010 1000   BUN 17.0 12/22/2014 1055   BUN 4* 06/13/2010 1000   CREATININE 0.8 12/22/2014 1055   CREATININE 0.75 06/13/2010 1000      Component Value Date/Time   CALCIUM 9.2 12/22/2014 1055   CALCIUM 8.4 06/13/2010 1000   ALKPHOS 75 12/22/2014 1055   ALKPHOS 211* 06/13/2010 1000   AST 20 12/22/2014 1055   AST 109* 06/13/2010 1000   ALT 15 12/22/2014 1055   ALT 217* 06/13/2010 1000   BILITOT 0.84 12/22/2014 1055   BILITOT 0.8 06/13/2010 1000    ASSESSMENT & PLAN:  Follicular lymphoma grade  3a Overall, he tolerated treatment well without any side effects. Recommend we proceed with a course of treatment next week The patient is asking about shorter course of treatment or additional supplemental IV vitamin C. I recommend the patient to proceed with 6 courses of chemotherapy as prescribed and there is no data to use IV vitamin C.  Allergic drug rash The patient have allergic drug rash which I suspect could be due to allopurinol. I told him to discontinue to treatment. We discussed about conservative management with Benadryl and over-the-counter hydrocortisone cream. The patient declined.   No orders of the defined types were placed in this encounter.   All questions were answered. The patient knows to call the clinic with any problems, questions or concerns. No barriers to learning was detected. I spent 15 minutes counseling the patient face to face. The total time spent in the appointment was 20 minutes and  more than 50% was on counseling and review of test results     [facility], Janisha Bueso, MD 01/18/2015 5:16 PM

## 2015-01-18 NOTE — Assessment & Plan Note (Addendum)
Overall, he tolerated treatment well without any side effects. Recommend we proceed with a course of treatment next week The patient is asking about shorter course of treatment or additional supplemental IV vitamin C. I recommend the patient to proceed with 6 courses of chemotherapy as prescribed and there is no data to use IV vitamin C.

## 2015-01-18 NOTE — Assessment & Plan Note (Signed)
The patient have allergic drug rash which I suspect could be due to allopurinol. I told him to discontinue to treatment. We discussed about conservative management with Benadryl and over-the-counter hydrocortisone cream. The patient declined.

## 2015-01-26 ENCOUNTER — Ambulatory Visit (HOSPITAL_BASED_OUTPATIENT_CLINIC_OR_DEPARTMENT_OTHER): Payer: 59 | Admitting: Hematology and Oncology

## 2015-01-26 ENCOUNTER — Telehealth: Payer: Self-pay | Admitting: Hematology and Oncology

## 2015-01-26 ENCOUNTER — Encounter: Payer: Self-pay | Admitting: Hematology and Oncology

## 2015-01-26 ENCOUNTER — Other Ambulatory Visit (HOSPITAL_BASED_OUTPATIENT_CLINIC_OR_DEPARTMENT_OTHER): Payer: 59

## 2015-01-26 ENCOUNTER — Ambulatory Visit (HOSPITAL_BASED_OUTPATIENT_CLINIC_OR_DEPARTMENT_OTHER): Payer: 59

## 2015-01-26 ENCOUNTER — Ambulatory Visit: Payer: 59

## 2015-01-26 VITALS — BP 107/64 | HR 69 | Temp 98.2°F | Resp 18 | Ht 67.0 in | Wt 137.7 lb

## 2015-01-26 VITALS — BP 108/65 | HR 71 | Temp 97.3°F | Resp 16

## 2015-01-26 DIAGNOSIS — C8231 Follicular lymphoma grade IIIa, lymph nodes of head, face, and neck: Secondary | ICD-10-CM

## 2015-01-26 DIAGNOSIS — Z5111 Encounter for antineoplastic chemotherapy: Secondary | ICD-10-CM

## 2015-01-26 DIAGNOSIS — Z5112 Encounter for antineoplastic immunotherapy: Secondary | ICD-10-CM

## 2015-01-26 DIAGNOSIS — C8230 Follicular lymphoma grade IIIa, unspecified site: Secondary | ICD-10-CM

## 2015-01-26 DIAGNOSIS — L270 Generalized skin eruption due to drugs and medicaments taken internally: Secondary | ICD-10-CM

## 2015-01-26 DIAGNOSIS — Z5189 Encounter for other specified aftercare: Secondary | ICD-10-CM

## 2015-01-26 LAB — CBC WITH DIFFERENTIAL/PLATELET
BASO%: 0.8 % (ref 0.0–2.0)
Basophils Absolute: 0 10*3/uL (ref 0.0–0.1)
EOS%: 1.3 % (ref 0.0–7.0)
Eosinophils Absolute: 0.1 10*3/uL (ref 0.0–0.5)
HCT: 39.6 % (ref 38.4–49.9)
HEMOGLOBIN: 13.2 g/dL (ref 13.0–17.1)
LYMPH%: 19 % (ref 14.0–49.0)
MCH: 29.7 pg (ref 27.2–33.4)
MCHC: 33.4 g/dL (ref 32.0–36.0)
MCV: 89 fL (ref 79.3–98.0)
MONO#: 0.4 10*3/uL (ref 0.1–0.9)
MONO%: 7 % (ref 0.0–14.0)
NEUT%: 71.9 % (ref 39.0–75.0)
NEUTROS ABS: 4 10*3/uL (ref 1.5–6.5)
PLATELETS: 308 10*3/uL (ref 140–400)
RBC: 4.45 10*6/uL (ref 4.20–5.82)
RDW: 14.1 % (ref 11.0–14.6)
WBC: 5.5 10*3/uL (ref 4.0–10.3)
lymph#: 1.1 10*3/uL (ref 0.9–3.3)

## 2015-01-26 LAB — COMPREHENSIVE METABOLIC PANEL (CC13)
ALT: 21 U/L (ref 0–55)
ANION GAP: 8 meq/L (ref 3–11)
AST: 17 U/L (ref 5–34)
Albumin: 4 g/dL (ref 3.5–5.0)
Alkaline Phosphatase: 85 U/L (ref 40–150)
BUN: 10.7 mg/dL (ref 7.0–26.0)
CALCIUM: 9 mg/dL (ref 8.4–10.4)
CO2: 25 mEq/L (ref 22–29)
Chloride: 107 mEq/L (ref 98–109)
Creatinine: 0.7 mg/dL (ref 0.7–1.3)
EGFR: >90 mL/min/{1.73_m2} (ref >90)
GLUCOSE: 120 mg/dL (ref 70–140)
Potassium: 3.7 mEq/L (ref 3.5–5.1)
SODIUM: 140 meq/L (ref 136–145)
Total Bilirubin: 0.4 mg/dL (ref 0.20–1.20)
Total Protein: 6.4 g/dL (ref 6.4–8.3)

## 2015-01-26 MED ORDER — SODIUM CHLORIDE 0.9 % IJ SOLN
10.0000 mL | INTRAMUSCULAR | Status: DC | PRN
  Filled 2015-01-26: qty 10

## 2015-01-26 MED ORDER — VINCRISTINE SULFATE CHEMO INJECTION 1 MG/ML
2.0000 mg | Freq: Once | INTRAVENOUS | Status: AC
  Administered 2015-01-26: 2 mg via INTRAVENOUS
  Filled 2015-01-26: qty 2

## 2015-01-26 MED ORDER — DIPHENHYDRAMINE HCL 25 MG PO CAPS
50.0000 mg | ORAL_CAPSULE | Freq: Once | ORAL | Status: AC
  Administered 2015-01-26: 50 mg via ORAL

## 2015-01-26 MED ORDER — RITUXIMAB CHEMO INJECTION 500 MG/50ML
375.0000 mg/m2 | Freq: Once | INTRAVENOUS | Status: DC
  Filled 2015-01-26: qty 70

## 2015-01-26 MED ORDER — DOXORUBICIN HCL CHEMO IV INJECTION 2 MG/ML
50.0000 mg/m2 | Freq: Once | INTRAVENOUS | Status: AC
  Administered 2015-01-26: 90 mg via INTRAVENOUS
  Filled 2015-01-26: qty 45

## 2015-01-26 MED ORDER — ACETAMINOPHEN 325 MG PO TABS
ORAL_TABLET | ORAL | Status: AC
  Filled 2015-01-26: qty 2

## 2015-01-26 MED ORDER — ACETAMINOPHEN 325 MG PO TABS
650.0000 mg | ORAL_TABLET | Freq: Once | ORAL | Status: AC
  Administered 2015-01-26: 650 mg via ORAL

## 2015-01-26 MED ORDER — SODIUM CHLORIDE 0.9 % IV SOLN
750.0000 mg/m2 | Freq: Once | INTRAVENOUS | Status: AC
  Administered 2015-01-26: 1340 mg via INTRAVENOUS
  Filled 2015-01-26: qty 67

## 2015-01-26 MED ORDER — PEGFILGRASTIM 6 MG/0.6ML ~~LOC~~ PSKT
6.0000 mg | PREFILLED_SYRINGE | Freq: Once | SUBCUTANEOUS | Status: AC
  Administered 2015-01-26: 6 mg via SUBCUTANEOUS
  Filled 2015-01-26: qty 0.6

## 2015-01-26 MED ORDER — SODIUM CHLORIDE 0.9 % IV SOLN
Freq: Once | INTRAVENOUS | Status: AC
  Administered 2015-01-26: 10:00:00 via INTRAVENOUS
  Filled 2015-01-26: qty 8

## 2015-01-26 MED ORDER — HEPARIN SOD (PORK) LOCK FLUSH 100 UNIT/ML IV SOLN
500.0000 [IU] | Freq: Once | INTRAVENOUS | Status: AC | PRN
  Administered 2015-01-26: 500 [IU]
  Filled 2015-01-26: qty 5

## 2015-01-26 MED ORDER — SODIUM CHLORIDE 0.9 % IJ SOLN
10.0000 mL | INTRAMUSCULAR | Status: DC | PRN
  Administered 2015-01-26: 10 mL via INTRAVENOUS
  Filled 2015-01-26: qty 10

## 2015-01-26 MED ORDER — PREDNISONE 20 MG PO TABS: 60.0000 mg | ORAL_TABLET | Freq: Every day | ORAL | Status: DC

## 2015-01-26 MED ORDER — SODIUM CHLORIDE 0.9 % IV SOLN
Freq: Once | INTRAVENOUS | Status: AC
  Administered 2015-01-26: 09:00:00 via INTRAVENOUS

## 2015-01-26 MED ORDER — DIPHENHYDRAMINE HCL 25 MG PO CAPS
ORAL_CAPSULE | ORAL | Status: AC
  Filled 2015-01-26: qty 2

## 2015-01-26 NOTE — Patient Instructions (Signed)
[facility] Discharge Instructions for Patients Receiving Chemotherapy  Today you received the following chemotherapy agents :  Adriamycin, Vincristine, Cytoxan, Rituxan.  To help prevent nausea and vomiting after your treatment, we encourage you to take your nausea medication as prescribed.  Take Prednisone 60 mg daily  as instructed on day 2 - 5;  Take  Zofran 8mg  by mouth every 8 hours as needed for nausea;  Can also take Compazine 10mg  by mouth every 6 hours as needed for nausea.  DO NOT Drive after taking Compazine as it can cause drowsiness. DO NOT Eat  Greasy  Nor  Spicy  Foods.   DO Drink  Lots  Of  Fluids  As tolerated.   If you develop nausea and vomiting that is not controlled by your nausea medication, call the clinic.   BELOW ARE SYMPTOMS THAT SHOULD BE REPORTED IMMEDIATELY:  *FEVER GREATER THAN 100.5 F  *CHILLS WITH OR WITHOUT FEVER  NAUSEA AND VOMITING THAT IS NOT CONTROLLED WITH YOUR NAUSEA MEDICATION  *UNUSUAL SHORTNESS OF BREATH  *UNUSUAL BRUISING OR BLEEDING  TENDERNESS IN MOUTH AND THROAT WITH OR WITHOUT PRESENCE OF ULCERS  *URINARY PROBLEMS  *BOWEL PROBLEMS  UNUSUAL RASH Items with * indicate a potential emergency and should be followed up as soon as possible.  Feel free to call the clinic you have any questions or concerns. The clinic phone number is [phone].  Please show the [facility] at check-in to the Emergency Department and triage nurse.  Pegfilgrastim injection What is this medicine? PEGFILGRASTIM (peg fil GRA stim) is a long-acting granulocyte colony-stimulating factor that stimulates the growth of neutrophils, a type of white blood cell important in the body's fight against infection. It is used to reduce the incidence of fever and infection in patients with certain types of cancer who are receiving chemotherapy that affects the bone marrow. This medicine may be used for other purposes; ask your health care provider or  pharmacist if you have questions. COMMON BRAND NAME(S): Neulasta What should I tell my health care provider before I take this medicine? They need to know if you have any of these conditions: -latex allergy -ongoing radiation therapy -sickle cell disease -skin reactions to acrylic adhesives (On-Body Injector only) -an unusual or allergic reaction to pegfilgrastim, filgrastim, other medicines, foods, dyes, or preservatives -pregnant or trying to get pregnant -breast-feeding How should I use this medicine? This medicine is for injection under the skin. If you get this medicine at home, you will be taught how to prepare and give the pre-filled syringe or how to use the On-body Injector. Refer to the patient Instructions for Use for detailed instructions. Use exactly as directed. Take your medicine at regular intervals. Do not take your medicine more often than directed. It is important that you put your used needles and syringes in a special sharps container. Do not put them in a trash can. If you do not have a sharps container, call your pharmacist or healthcare provider to get one. Talk to your pediatrician regarding the use of this medicine in children. Special care may be needed. Overdosage: If you think you have taken too much of this medicine contact a poison control center or emergency room at once. NOTE: This medicine is only for you. Do not share this medicine with others. What if I miss a dose? It is important not to miss your dose. Call your doctor or health care professional if you miss your dose. If you miss  a dose due to an On-body Injector failure or leakage, a new dose should be administered as soon as possible using a single prefilled syringe for manual use. What may interact with this medicine? Interactions have not been studied. Give your health care provider a list of all the medicines, herbs, non-prescription drugs, or dietary supplements you use. Also tell them if you smoke,  drink alcohol, or use illegal drugs. Some items may interact with your medicine. This list may not describe all possible interactions. Give your health care provider a list of all the medicines, herbs, non-prescription drugs, or dietary supplements you use. Also tell them if you smoke, drink alcohol, or use illegal drugs. Some items may interact with your medicine. What should I watch for while using this medicine? You may need blood work done while you are taking this medicine. If you are going to need a MRI, CT scan, or other procedure, tell your doctor that you are using this medicine (On-Body Injector only). What side effects may I notice from receiving this medicine? Side effects that you should report to your doctor or health care professional as soon as possible: -allergic reactions like skin rash, itching or hives, swelling of the face, lips, or tongue -dizziness -fever -pain, redness, or irritation at site where injected -pinpoint red spots on the skin -shortness of breath or breathing problems -stomach or side pain, or pain at the shoulder -swelling -tiredness -trouble passing urine Side effects that usually do not require medical attention (report to your doctor or health care professional if they continue or are bothersome): -bone pain -muscle pain This list may not describe all possible side effects. Call your doctor for medical advice about side effects. You may report side effects to FDA at 1-800-FDA-1088. Where should I keep my medicine? Keep out of the reach of children. Store pre-filled syringes in a refrigerator between 2 and 8 degrees C (36 and 46 degrees F). Do not freeze. Keep in carton to protect from light. Throw away this medicine if it is left out of the refrigerator for more than 48 hours. Throw away any unused medicine after the expiration date. NOTE: This sheet is a summary. It may not cover all possible information. If you have questions about this medicine, talk  to your doctor, pharmacist, or health care provider.  2015, [employer]vier/[employer] Standard. (2013-09-15 16:14:05)

## 2015-01-26 NOTE — Telephone Encounter (Signed)
Pt confirmed labs/ov per 07/29 POF, gave pt AVS and Calendar..... KJ

## 2015-01-26 NOTE — Assessment & Plan Note (Signed)
He tolerated treatment well. I will presume cycle 2 of treatment without dose adjustment. I will order a PET CT scan to be done prior to cycle 3 to assess response to treatment.

## 2015-01-26 NOTE — Patient Instructions (Signed)

## 2015-01-26 NOTE — Assessment & Plan Note (Signed)
His skin rash has dried out. There is no further new rash. I suspect the skin rashes due to dermatitis. I reassured the patient.

## 2015-01-26 NOTE — Progress Notes (Signed)
[facility] OFFICE PROGRESS NOTE  Patient Care Team: Leighton Ruff, MD as PCP - General (Family Medicine) Melissa Montane, MD as Consulting Physician (Otolaryngology) Heath Lark, MD as Consulting Physician (Hematology and Oncology)  SUMMARY OF ONCOLOGIC HISTORY: Oncology History   Follicular lymphoma grade 3a   Staging form: Lymphoid Neoplasms, AJCC 6th Edition     Clinical stage from 12/27/2014: Stage III - Signed by Heath Lark, MD on 01/22/3663        Follicular lymphoma grade 3a   10/31/2014 Pathology Results Accession: [id number] FNA showed atypical lymphoid population   11/28/2014 Pathology Results Accession: [id number] Biopsy showed high grade follicular lymphoma   3/32/9518 Surgery He had open biopsy of left neck mass   12/22/2014 Imaging [facility] showed normal EF   12/22/2014 Imaging PET scan showed stage III disease   12/22/2014 Bone Marrow Biopsy Bone marrow biopsy was negative   01/05/2015 -  Chemotherapy He received cycle one of R- [facility] chemotherapy    INTERVAL HISTORY: Please see below for problem oriented charting. He feels well. The skin rash has dried out. He has no other symptoms. Denies any side effects from recent treatment such as neuropathy, mucositis, nausea or vomiting  REVIEW OF SYSTEMS:   Constitutional: Denies fevers, chills or abnormal weight loss Eyes: Denies blurriness of vision Ears, nose, mouth, throat, and face: Denies mucositis or sore throat Respiratory: Denies cough, dyspnea or wheezes Cardiovascular: Denies palpitation, chest discomfort or lower extremity swelling Gastrointestinal:  Denies nausea, heartburn or change in bowel habits Skin: Denies abnormal skin rashes Lymphatics: Denies new lymphadenopathy or easy bruising Neurological:Denies numbness, tingling or new weaknesses Behavioral/Psych: Mood is stable, no new changes  All other systems were reviewed with the patient and are negative.  I have reviewed the past medical history,  past surgical history, social history and family history with the patient and they are unchanged from previous note.  ALLERGIES:  is allergic to allopurinol.  MEDICATIONS:  Current Outpatient Prescriptions  Medication Sig Dispense Refill  . lidocaine-prilocaine (EMLA) cream Apply to affected area once 30 g 3  . ondansetron (ZOFRAN) 8 MG tablet Take 1 tablet (8 mg total) by mouth every 8 (eight) hours as needed. 30 tablet 1  . predniSONE (DELTASONE) 20 MG tablet Take 3 tablets (60 mg total) by mouth daily with breakfast. Take on days 2-5 of chemotherapy. 60 tablet 0  . prochlorperazine (COMPAZINE) 10 MG tablet Take 1 tablet (10 mg total) by mouth every 6 (six) hours as needed (Nausea or vomiting). 30 tablet 6   No current facility-administered medications for this visit.   Facility-Administered Medications Ordered in Other Visits  Medication Dose Route Frequency Provider Last Rate Last Dose  . sodium chloride 0.9 % injection 10 mL  10 mL Intravenous PRN Heath Lark, MD        PHYSICAL EXAMINATION: ECOG PERFORMANCE STATUS: 0 - Asymptomatic  Filed Vitals:   01/26/15 0835  BP: 107/64  Pulse: 69  Temp: 98.2 F (36.8 C)  Resp: 18   Filed Weights   01/26/15 0835  Weight: 137 lb 11.2 oz (62.46 kg)    GENERAL:alert, no distress and comfortable SKIN: Prior skin rash has dried up. No new lesions are seen. EYES: normal, Conjunctiva are pink and non-injected, sclera clear OROPHARYNX:no exudate, no erythema and lips, buccal mucosa, and tongue normal  NECK: supple, thyroid normal size, non-tender, without nodularity LYMPH:  no palpable lymphadenopathy in the cervical, axillary or inguinal LUNGS: clear to auscultation and percussion with normal  breathing effort HEART: regular rate & rhythm and no murmurs and no lower extremity edema ABDOMEN:abdomen soft, non-tender and normal bowel sounds Musculoskeletal:no cyanosis of digits and no clubbing  NEURO: alert & oriented x 3 with fluent  speech, no focal motor/sensory deficits  LABORATORY DATA:  I have reviewed the data as listed    Component Value Date/Time   NA 140 01/26/2015 0811   NA 138 06/13/2010 1000   K 3.7 01/26/2015 0811   K 4.0 06/13/2010 1000   CL 100 06/13/2010 1000   CO2 25 01/26/2015 0811   CO2 30 06/13/2010 1000   GLUCOSE 120 01/26/2015 0811   GLUCOSE 126* 06/13/2010 1000   BUN 10.7 01/26/2015 0811   BUN 4* 06/13/2010 1000   CREATININE 0.7 01/26/2015 0811   CREATININE 0.75 06/13/2010 1000   CALCIUM 9.0 01/26/2015 0811   CALCIUM 8.4 06/13/2010 1000   PROT 6.4 01/26/2015 0811   PROT 5.9* 06/13/2010 1000   ALBUMIN 4.0 01/26/2015 0811   ALBUMIN 2.8* 06/13/2010 1000   AST 17 01/26/2015 0811   AST 109* 06/13/2010 1000   ALT 21 01/26/2015 0811   ALT 217* 06/13/2010 1000   ALKPHOS 85 01/26/2015 0811   ALKPHOS 211* 06/13/2010 1000   BILITOT 0.40 01/26/2015 0811   BILITOT 0.8 06/13/2010 1000   GFRNONAA >60 06/13/2010 1000   GFRAA  06/13/2010 1000    >60        The eGFR has been calculated using the MDRD equation. This calculation has not been validated in all clinical situations. eGFR's persistently <60 mL/min signify possible Chronic Kidney Disease.    No results found for: SPEP, UPEP  Lab Results  Component Value Date   WBC 5.5 01/26/2015   NEUTROABS 4.0 01/26/2015   HGB 13.2 01/26/2015   HCT 39.6 01/26/2015   MCV 89.0 01/26/2015   PLT 308 01/26/2015      Chemistry      Component Value Date/Time   NA 140 01/26/2015 0811   NA 138 06/13/2010 1000   K 3.7 01/26/2015 0811   K 4.0 06/13/2010 1000   CL 100 06/13/2010 1000   CO2 25 01/26/2015 0811   CO2 30 06/13/2010 1000   BUN 10.7 01/26/2015 0811   BUN 4* 06/13/2010 1000   CREATININE 0.7 01/26/2015 0811   CREATININE 0.75 06/13/2010 1000      Component Value Date/Time   CALCIUM 9.0 01/26/2015 0811   CALCIUM 8.4 06/13/2010 1000   ALKPHOS 85 01/26/2015 0811   ALKPHOS 211* 06/13/2010 1000   AST 17 01/26/2015 0811   AST  109* 06/13/2010 1000   ALT 21 01/26/2015 0811   ALT 217* 06/13/2010 1000   BILITOT 0.40 01/26/2015 0811   BILITOT 0.8 06/13/2010 1000      ASSESSMENT & PLAN:  Follicular lymphoma grade 3a He tolerated treatment well. I will presume cycle 2 of treatment without dose adjustment. I will order a PET CT scan to be done prior to cycle 3 to assess response to treatment.  Allergic drug rash His skin rash has dried out. There is no further new rash. I suspect the skin rashes due to dermatitis. I reassured the patient.   Orders Placed This Encounter  Procedures  . NM PET Image Restag (PS) Skull Base To Thigh    Standing Status: Future     Number of Occurrences:      Standing Expiration Date: 03/27/2016    Order Specific Question:  Reason for Exam (SYMPTOM  OR DIAGNOSIS REQUIRED)  Answer:  staging lymphoma assess response to Rx    Order Specific Question:  Preferred imaging location?    Answer:  [facility]   All questions were answered. The patient knows to call the clinic with any problems, questions or concerns. No barriers to learning was detected. I spent 25 minutes counseling the patient face to face. The total time spent in the appointment was 30 minutes and more than 50% was on counseling and review of test results     [facility], Benjamin, MD 01/26/2015 9:19 AM

## 2015-02-15 ENCOUNTER — Other Ambulatory Visit: Payer: 59

## 2015-02-15 ENCOUNTER — Ambulatory Visit (HOSPITAL_COMMUNITY)
Admission: RE | Admit: 2015-02-15 | Discharge: 2015-02-15 | Disposition: A | Discharge status: Home / Self Care | Payer: 59 | Source: Ambulatory Visit | Attending: Hematology and Oncology | Admitting: Hematology and Oncology

## 2015-02-15 ENCOUNTER — Other Ambulatory Visit (HOSPITAL_BASED_OUTPATIENT_CLINIC_OR_DEPARTMENT_OTHER): Payer: 59

## 2015-02-15 DIAGNOSIS — C8230 Follicular lymphoma grade IIIa, unspecified site: Secondary | ICD-10-CM | POA: Insufficient documentation

## 2015-02-15 DIAGNOSIS — C8231 Follicular lymphoma grade IIIa, lymph nodes of head, face, and neck: Secondary | ICD-10-CM | POA: Diagnosis not present

## 2015-02-15 LAB — CBC WITH DIFFERENTIAL/PLATELET
BASO%: 1.2 % (ref 0.0–2.0)
BASOS ABS: 0.1 10*3/uL (ref 0.0–0.1)
EOS%: 1.6 % (ref 0.0–7.0)
Eosinophils Absolute: 0.1 10*3/uL (ref 0.0–0.5)
HCT: 37.6 % — ABNORMAL LOW (ref 38.4–49.9)
HGB: 12.5 g/dL — ABNORMAL LOW (ref 13.0–17.1)
LYMPH#: 1.1 10*3/uL (ref 0.9–3.3)
LYMPH%: 19.9 % (ref 14.0–49.0)
MCH: 29.8 pg (ref 27.2–33.4)
MCHC: 33.4 g/dL (ref 32.0–36.0)
MCV: 89.3 fL (ref 79.3–98.0)
MONO#: 0.6 10*3/uL (ref 0.1–0.9)
MONO%: 11.3 % (ref 0.0–14.0)
NEUT#: 3.7 10*3/uL (ref 1.5–6.5)
NEUT%: 66 % (ref 39.0–75.0)
Platelets: 269 10*3/uL (ref 140–400)
RBC: 4.21 10*6/uL (ref 4.20–5.82)
RDW: 15.6 % — ABNORMAL HIGH (ref 11.0–14.6)
WBC: 5.6 10*3/uL (ref 4.0–10.3)

## 2015-02-15 LAB — COMPREHENSIVE METABOLIC PANEL (CC13)
ALT: 16 U/L (ref 0–55)
AST: 14 U/L (ref 5–34)
Albumin: 4.1 g/dL (ref 3.5–5.0)
Alkaline Phosphatase: 77 U/L (ref 40–150)
Anion Gap: 10 mEq/L (ref 3–11)
BUN: 12.7 mg/dL (ref 7.0–26.0)
CO2: 26 meq/L (ref 22–29)
CREATININE: 0.8 mg/dL (ref 0.7–1.3)
Calcium: 9.3 mg/dL (ref 8.4–10.4)
Chloride: 107 mEq/L (ref 98–109)
EGFR: >90 mL/min/{1.73_m2} (ref >90)
Glucose: 86 mg/dl (ref 70–140)
POTASSIUM: 4 meq/L (ref 3.5–5.1)
SODIUM: 142 meq/L (ref 136–145)
Total Bilirubin: 0.44 mg/dL (ref 0.20–1.20)
Total Protein: 6.1 g/dL — ABNORMAL LOW (ref 6.4–8.3)

## 2015-02-15 LAB — GLUCOSE, CAPILLARY: Glucose-Capillary: 89 mg/dL (ref 65–99)

## 2015-02-15 MED ORDER — FLUDEOXYGLUCOSE F - 18 (FDG) INJECTION
6.8800 | Freq: Once | INTRAVENOUS | Status: DC | PRN
  Administered 2015-02-15: 6.88 via INTRAVENOUS
  Filled 2015-02-15: qty 6.88

## 2015-02-16 ENCOUNTER — Ambulatory Visit (HOSPITAL_BASED_OUTPATIENT_CLINIC_OR_DEPARTMENT_OTHER): Payer: 59 | Admitting: Hematology and Oncology

## 2015-02-16 ENCOUNTER — Encounter: Payer: Self-pay | Admitting: Hematology and Oncology

## 2015-02-16 ENCOUNTER — Ambulatory Visit (HOSPITAL_BASED_OUTPATIENT_CLINIC_OR_DEPARTMENT_OTHER): Payer: 59

## 2015-02-16 ENCOUNTER — Telehealth: Payer: Self-pay | Admitting: Hematology and Oncology

## 2015-02-16 VITALS — BP 113/57 | HR 69 | Temp 98.4°F | Resp 20

## 2015-02-16 VITALS — BP 118/65 | HR 77 | Temp 98.0°F | Resp 18 | Ht 67.0 in | Wt 140.3 lb

## 2015-02-16 DIAGNOSIS — C8231 Follicular lymphoma grade IIIa, lymph nodes of head, face, and neck: Secondary | ICD-10-CM

## 2015-02-16 DIAGNOSIS — Z5189 Encounter for other specified aftercare: Secondary | ICD-10-CM

## 2015-02-16 DIAGNOSIS — Z5111 Encounter for antineoplastic chemotherapy: Secondary | ICD-10-CM | POA: Diagnosis not present

## 2015-02-16 DIAGNOSIS — K1231 Oral mucositis (ulcerative) due to antineoplastic therapy: Secondary | ICD-10-CM | POA: Diagnosis not present

## 2015-02-16 DIAGNOSIS — D6481 Anemia due to antineoplastic chemotherapy: Secondary | ICD-10-CM | POA: Insufficient documentation

## 2015-02-16 DIAGNOSIS — T451X5A Adverse effect of antineoplastic and immunosuppressive drugs, initial encounter: Secondary | ICD-10-CM

## 2015-02-16 DIAGNOSIS — C8230 Follicular lymphoma grade IIIa, unspecified site: Secondary | ICD-10-CM

## 2015-02-16 DIAGNOSIS — Z5112 Encounter for antineoplastic immunotherapy: Secondary | ICD-10-CM

## 2015-02-16 MED ORDER — SODIUM CHLORIDE 0.9 % IV SOLN
Freq: Once | INTRAVENOUS | Status: AC
  Administered 2015-02-16: 11:00:00 via INTRAVENOUS

## 2015-02-16 MED ORDER — SODIUM CHLORIDE 0.9 % IV SOLN
375.0000 mg/m2 | Freq: Once | INTRAVENOUS | Status: AC
  Administered 2015-02-16: 700 mg via INTRAVENOUS
  Filled 2015-02-16: qty 70

## 2015-02-16 MED ORDER — ACETAMINOPHEN 325 MG PO TABS
ORAL_TABLET | ORAL | Status: AC
  Filled 2015-02-16: qty 2

## 2015-02-16 MED ORDER — DOXORUBICIN HCL CHEMO IV INJECTION 2 MG/ML
50.0000 mg/m2 | Freq: Once | INTRAVENOUS | Status: AC
  Administered 2015-02-16: 90 mg via INTRAVENOUS
  Filled 2015-02-16: qty 45

## 2015-02-16 MED ORDER — DIPHENHYDRAMINE HCL 25 MG PO CAPS
ORAL_CAPSULE | ORAL | Status: AC
  Filled 2015-02-16: qty 2

## 2015-02-16 MED ORDER — ACETAMINOPHEN 325 MG PO TABS
650.0000 mg | ORAL_TABLET | Freq: Once | ORAL | Status: AC
  Administered 2015-02-16: 650 mg via ORAL

## 2015-02-16 MED ORDER — SODIUM CHLORIDE 0.9 % IV SOLN
750.0000 mg/m2 | Freq: Once | INTRAVENOUS | Status: AC
  Administered 2015-02-16: 1340 mg via INTRAVENOUS
  Filled 2015-02-16: qty 67

## 2015-02-16 MED ORDER — SODIUM CHLORIDE 0.9 % IV SOLN
Freq: Once | INTRAVENOUS | Status: AC
  Administered 2015-02-16: 11:00:00 via INTRAVENOUS
  Filled 2015-02-16: qty 8

## 2015-02-16 MED ORDER — PEGFILGRASTIM 6 MG/0.6ML ~~LOC~~ PSKT
6.0000 mg | PREFILLED_SYRINGE | Freq: Once | SUBCUTANEOUS | Status: AC
  Administered 2015-02-16: 6 mg via SUBCUTANEOUS
  Filled 2015-02-16: qty 0.6

## 2015-02-16 MED ORDER — DIPHENHYDRAMINE HCL 25 MG PO CAPS
50.0000 mg | ORAL_CAPSULE | Freq: Once | ORAL | Status: AC
  Administered 2015-02-16: 50 mg via ORAL

## 2015-02-16 MED ORDER — SODIUM CHLORIDE 0.9 % IJ SOLN
10.0000 mL | INTRAMUSCULAR | Status: DC | PRN
  Administered 2015-02-16: 10 mL
  Filled 2015-02-16: qty 10

## 2015-02-16 MED ORDER — SODIUM CHLORIDE 0.9 % IV SOLN
2.0000 mg | Freq: Once | INTRAVENOUS | Status: AC
  Administered 2015-02-16: 2 mg via INTRAVENOUS
  Filled 2015-02-16: qty 2

## 2015-02-16 MED ORDER — HEPARIN SOD (PORK) LOCK FLUSH 100 UNIT/ML IV SOLN
500.0000 [IU] | Freq: Once | INTRAVENOUS | Status: AC | PRN
  Administered 2015-02-16: 500 [IU]
  Filled 2015-02-16: qty 5

## 2015-02-16 NOTE — Assessment & Plan Note (Signed)
This is likely due to recent treatment. The patient denies recent history of bleeding such as epistaxis, hematuria or hematochezia. He is asymptomatic from the anemia. I will observe for now.  He does not require transfusion now. I will continue the chemotherapy at current dose without dosage adjustment.  If the anemia gets progressive worse in the future, I might have to delay his treatment or adjust the chemotherapy dose.  

## 2015-02-16 NOTE — Assessment & Plan Note (Signed)
This is related to side effects of recent treatment. Continue conservative management.

## 2015-02-16 NOTE — Patient Instructions (Signed)
[facility] Discharge Instructions for Patients Receiving Chemotherapy  Today you received the following chemotherapy agents: adriamycin, vincristine, cytoxan, rituxan  To help prevent nausea and vomiting after your treatment, we encourage you to take your nausea medication.  Take it as often as prescribed.     If you develop nausea and vomiting that is not controlled by your nausea medication, call the clinic. If it is after clinic hours your family physician or the after hours number for the clinic or go to the Emergency Department.   BELOW ARE SYMPTOMS THAT SHOULD BE REPORTED IMMEDIATELY:  *FEVER GREATER THAN 100.5 F  *CHILLS WITH OR WITHOUT FEVER  NAUSEA AND VOMITING THAT IS NOT CONTROLLED WITH YOUR NAUSEA MEDICATION  *UNUSUAL SHORTNESS OF BREATH  *UNUSUAL BRUISING OR BLEEDING  TENDERNESS IN MOUTH AND THROAT WITH OR WITHOUT PRESENCE OF ULCERS  *URINARY PROBLEMS  *BOWEL PROBLEMS  UNUSUAL RASH Items with * indicate a potential emergency and should be followed up as soon as possible.  Feel free to call the clinic you have any questions or concerns. The clinic phone number is [phone].   I have been informed and understand all the instructions given to me. I know to contact the clinic, my physician, or go to the Emergency Department if any problems should occur. I do not have any questions at this time, but understand that I may call the clinic during office hours   should I have any questions or need assistance in obtaining follow up care.    __________________________________________  _____________  __________ Signature of Patient or Authorized Representative            Date                   Time    __________________________________________ Nurse's Signature

## 2015-02-16 NOTE — Telephone Encounter (Signed)
Gave adn printed appt sched and avs for pt for Sept °

## 2015-02-16 NOTE — Progress Notes (Signed)
[place] OFFICE PROGRESS NOTE  Patient Care Team: Leighton Ruff, MD as PCP - General (Family Medicine) Melissa Montane, MD as Consulting Physician (Otolaryngology) Heath Lark, MD as Consulting Physician (Hematology and Oncology)  SUMMARY OF ONCOLOGIC HISTORY: Oncology History   Follicular lymphoma grade 3a   Staging form: Lymphoid Neoplasms, AJCC 6th Edition     Clinical stage from 12/27/2014: Stage III - Signed by Heath Lark, MD on 01/09/4579        Follicular lymphoma grade 3a   10/31/2014 Pathology Results Accession: [id number] showed atypical lymphoid population   11/28/2014 Pathology Results Accession: [id number] Biopsy showed high grade follicular lymphoma   1/93/7902 Surgery He had open biopsy of left neck mass   12/22/2014 Imaging [facility] showed normal EF   12/22/2014 Imaging PET scan showed stage III disease   12/22/2014 Bone Marrow Biopsy Bone marrow biopsy was negative   01/05/2015 -  Chemotherapy He received R- CHOP chemotherapy   02/14/2015 Imaging PEt CT scan showed near complete response to Rx    INTERVAL HISTORY: Please see below for problem oriented charting. He is seen prior to cycle 3 of therapy. He had mucositis for 5 days after chemotherapy, resolved with conservative management. His skin rash has resolved Denies peripheral neuropathy  REVIEW OF SYSTEMS:   Constitutional: Denies fevers, chills or abnormal weight loss Eyes: Denies blurriness of vision Ears, nose, mouth, throat, and face: Denies mucositis or sore throat Respiratory: Denies cough, dyspnea or wheezes Cardiovascular: Denies palpitation, chest discomfort or lower extremity swelling Gastrointestinal:  Denies nausea, heartburn or change in bowel habits Skin: Denies abnormal skin rashes Lymphatics: Denies new lymphadenopathy or easy bruising Neurological:Denies numbness, tingling or new weaknesses Behavioral/Psych: Mood is stable, no new changes  All other systems were reviewed with the  patient and are negative.  I have reviewed the past medical history, past surgical history, social history and family history with the patient and they are unchanged from previous note.  ALLERGIES:  is allergic to allopurinol.  MEDICATIONS:  Current Outpatient Prescriptions  Medication Sig Dispense Refill  . lidocaine-prilocaine (EMLA) cream Apply to affected area once 30 g 3  . ondansetron (ZOFRAN) 8 MG tablet Take 1 tablet (8 mg total) by mouth every 8 (eight) hours as needed. 30 tablet 1  . predniSONE (DELTASONE) 20 MG tablet Take 3 tablets (60 mg total) by mouth daily with breakfast. Take on days 2-5 of chemotherapy. 60 tablet 0  . prochlorperazine (COMPAZINE) 10 MG tablet Take 1 tablet (10 mg total) by mouth every 6 (six) hours as needed (Nausea or vomiting). 30 tablet 6   No current facility-administered medications for this visit.   Facility-Administered Medications Ordered in Other Visits  Medication Dose Route Frequency Provider Last Rate Last Dose  . fludeoxyglucose F - 18 (FDG) injection 6.88 milli Curie  6.88 milli Curie Intravenous Once PRN Medication Radiologist, MD   6.88 milli Curie at 02/15/15 1047  . sodium chloride 0.9 % injection 10 mL  10 mL Intravenous PRN Heath Lark, MD   10 mL at 01/26/15 1346    PHYSICAL EXAMINATION: ECOG PERFORMANCE STATUS: 0 - Asymptomatic  Filed Vitals:   02/16/15 0958  BP: 118/65  Pulse: 77  Temp: 98 F (36.7 C)  Resp: 18   Filed Weights   02/16/15 0958  Weight: 140 lb 4.8 oz (63.64 kg)    GENERAL:alert, no distress and comfortable SKIN: skin color, texture, turgor are normal, no rashes or significant lesions EYES: normal, Conjunctiva are pink  and non-injected, sclera clear OROPHARYNX:no exudate, no erythema and lips, buccal mucosa, and tongue normal  NECK: supple, thyroid normal size, non-tender, without nodularity LYMPH:  no palpable lymphadenopathy in the cervical, axillary or inguinal LUNGS: clear to auscultation and  percussion with normal breathing effort HEART: regular rate & rhythm and no murmurs and no lower extremity edema ABDOMEN:abdomen soft, non-tender and normal bowel sounds Musculoskeletal:no cyanosis of digits and no clubbing  NEURO: alert & oriented x 3 with fluent speech, no focal motor/sensory deficits  LABORATORY DATA:  I have reviewed the data as listed    Component Value Date/Time   NA 142 02/15/2015 1016   NA 138 06/13/2010 1000   K 4.0 02/15/2015 1016   K 4.0 06/13/2010 1000   CL 100 06/13/2010 1000   CO2 26 02/15/2015 1016   CO2 30 06/13/2010 1000   GLUCOSE 86 02/15/2015 1016   GLUCOSE 126* 06/13/2010 1000   BUN 12.7 02/15/2015 1016   BUN 4* 06/13/2010 1000   CREATININE 0.8 02/15/2015 1016   CREATININE 0.75 06/13/2010 1000   CALCIUM 9.3 02/15/2015 1016   CALCIUM 8.4 06/13/2010 1000   PROT 6.1* 02/15/2015 1016   PROT 5.9* 06/13/2010 1000   ALBUMIN 4.1 02/15/2015 1016   ALBUMIN 2.8* 06/13/2010 1000   AST 14 02/15/2015 1016   AST 109* 06/13/2010 1000   ALT 16 02/15/2015 1016   ALT 217* 06/13/2010 1000   ALKPHOS 77 02/15/2015 1016   ALKPHOS 211* 06/13/2010 1000   BILITOT 0.44 02/15/2015 1016   BILITOT 0.8 06/13/2010 1000   GFRNONAA >60 06/13/2010 1000   GFRAA  06/13/2010 1000    >60        The eGFR has been calculated using the MDRD equation. This calculation has not been validated in all clinical situations. eGFR's persistently <60 mL/min signify possible Chronic Kidney Disease.    No results found for: SPEP, UPEP  Lab Results  Component Value Date   WBC 5.6 02/15/2015   NEUTROABS 3.7 02/15/2015   HGB 12.5* 02/15/2015   HCT 37.6* 02/15/2015   MCV 89.3 02/15/2015   PLT 269 02/15/2015      Chemistry      Component Value Date/Time   NA 142 02/15/2015 1016   NA 138 06/13/2010 1000   K 4.0 02/15/2015 1016   K 4.0 06/13/2010 1000   CL 100 06/13/2010 1000   CO2 26 02/15/2015 1016   CO2 30 06/13/2010 1000   BUN 12.7 02/15/2015 1016   BUN 4*  06/13/2010 1000   CREATININE 0.8 02/15/2015 1016   CREATININE 0.75 06/13/2010 1000      Component Value Date/Time   CALCIUM 9.3 02/15/2015 1016   CALCIUM 8.4 06/13/2010 1000   ALKPHOS 77 02/15/2015 1016   ALKPHOS 211* 06/13/2010 1000   AST 14 02/15/2015 1016   AST 109* 06/13/2010 1000   ALT 16 02/15/2015 1016   ALT 217* 06/13/2010 1000   BILITOT 0.44 02/15/2015 1016   BILITOT 0.8 06/13/2010 1000       RADIOGRAPHIC STUDIES: I have personally reviewed the radiological images as listed and agreed with the findings in the report. Nm Pet Image Restag (ps) Skull Base To Thigh  02/15/2015   CLINICAL DATA:  Subsequent treatment strategy for high-grade follicular lymphoma. Patient status 2 cycles chemotherapy.  EXAM: NUCLEAR MEDICINE PET SKULL BASE TO THIGH  TECHNIQUE: 6.8 mCi F-18 FDG was injected intravenously. Full-ring PET imaging was performed from the skull base to thigh after the radiotracer. CT data was  obtained and used for attenuation correction and anatomic localization.  FASTING BLOOD GLUCOSE:  Value: 89 mg/dl  COMPARISON:  PET-CT 12/22/2014  FINDINGS: NECK  Interval reduction in size of bulky LEFT supraclavicular and lower left neck hypermetabolic adenopathy. There is now only a single small residual focus of metabolic activity with SUV max equaled 2.5 and measuring 8 mm in thickness (image 42 fused data set. Previously bulky adenopathy measuring up to 47 mm in thickness and with SUV max equal 19.  CHEST  No hypermetabolic mediastinal or hilar nodes. No suspicious pulmonary nodules on the CT scan.  ABDOMEN/PELVIS  Marked reduction in size and metabolic activity of bulky retroperitoneal lymphadenopathy. The remaining enlarged lymph nodes have metabolic activity less than blood pool (image 119). Lymph nodes are also decreased in size measuring 12 mm short axis along the superior mesenteric artery compared to 24 mm on prior.  Spleen is normal volume and metabolic activity.  SKELETON  No focal  hypermetabolic activity to suggest skeletal metastasis.  IMPRESSION: 1. Near complete response to chemotherapy. 2. Single lymph node in the LEFT neck has activity above blood pool. The size and metabolic activity of the bulky LEFT neck lymphadenopathy has been dramaticly reduced. 3. No residual metabolic activity in the periaortic retroperitoneal adenopathy above background blood pool activity.   Electronically Signed   By: Suzy Bouchard M.D.   On: 02/15/2015 14:20     ASSESSMENT & PLAN:  Follicular lymphoma grade 3a He has excellent response to treatment. We will proceed with total of 6 cycles of treatment as planned.   Mucositis due to chemotherapy This is related to side effects of recent treatment. Continue conservative management.  Anemia due to antineoplastic chemotherapy This is likely due to recent treatment. The patient denies recent history of bleeding such as epistaxis, hematuria or hematochezia. He is asymptomatic from the anemia. I will observe for now.  He does not require transfusion now. I will continue the chemotherapy at current dose without dosage adjustment.  If the anemia gets progressive worse in the future, I might have to delay his treatment or adjust the chemotherapy dose.    No orders of the defined types were placed in this encounter.   All questions were answered. The patient knows to call the clinic with any problems, questions or concerns. No barriers to learning was detected. I spent 25 minutes counseling the patient face to face. The total time spent in the appointment was 30 minutes and more than 50% was on counseling and review of test results     [facility], [place], MD 02/16/2015 10:54 AM

## 2015-02-16 NOTE — Assessment & Plan Note (Signed)
He has excellent response to treatment. We will proceed with total of 6 cycles of treatment as planned.

## 2015-03-02 ENCOUNTER — Encounter: Payer: Self-pay | Admitting: Hematology and Oncology

## 2015-03-05 ENCOUNTER — Encounter: Payer: Self-pay | Admitting: Hematology and Oncology

## 2015-03-09 ENCOUNTER — Ambulatory Visit (HOSPITAL_BASED_OUTPATIENT_CLINIC_OR_DEPARTMENT_OTHER): Payer: 59 | Admitting: Hematology and Oncology

## 2015-03-09 ENCOUNTER — Encounter: Payer: Self-pay | Admitting: Hematology and Oncology

## 2015-03-09 ENCOUNTER — Ambulatory Visit: Payer: 59

## 2015-03-09 ENCOUNTER — Other Ambulatory Visit (HOSPITAL_BASED_OUTPATIENT_CLINIC_OR_DEPARTMENT_OTHER): Payer: 59

## 2015-03-09 VITALS — BP 135/74 | HR 56 | Temp 97.5°F | Resp 18 | Ht 67.0 in | Wt 140.2 lb

## 2015-03-09 DIAGNOSIS — C8230 Follicular lymphoma grade IIIa, unspecified site: Secondary | ICD-10-CM

## 2015-03-09 LAB — COMPREHENSIVE METABOLIC PANEL (CC13)
ALBUMIN: 4.1 g/dL (ref 3.5–5.0)
ALT: 10 U/L (ref 0–55)
ANION GAP: 6 meq/L (ref 3–11)
AST: 16 U/L (ref 5–34)
Alkaline Phosphatase: 82 U/L (ref 40–150)
BILIRUBIN TOTAL: 0.67 mg/dL (ref 0.20–1.20)
BUN: 16.8 mg/dL (ref 7.0–26.0)
CO2: 29 meq/L (ref 22–29)
CREATININE: 0.7 mg/dL (ref 0.7–1.3)
Calcium: 9.2 mg/dL (ref 8.4–10.4)
Chloride: 110 mEq/L — ABNORMAL HIGH (ref 98–109)
EGFR: >90 mL/min/{1.73_m2} (ref >90)
Glucose: 83 mg/dl (ref 70–140)
Potassium: 4 mEq/L (ref 3.5–5.1)
Sodium: 145 mEq/L (ref 136–145)
TOTAL PROTEIN: 6.2 g/dL — AB (ref 6.4–8.3)

## 2015-03-09 LAB — CBC WITH DIFFERENTIAL/PLATELET
BASO%: 1.1 % (ref 0.0–2.0)
Basophils Absolute: 0 10*3/uL (ref 0.0–0.1)
EOS%: 1.8 % (ref 0.0–7.0)
Eosinophils Absolute: 0.1 10*3/uL (ref 0.0–0.5)
HEMATOCRIT: 35.9 % — AB (ref 38.4–49.9)
HEMOGLOBIN: 12 g/dL — AB (ref 13.0–17.1)
LYMPH#: 0.9 10*3/uL (ref 0.9–3.3)
LYMPH%: 21.4 % (ref 14.0–49.0)
MCH: 30.3 pg (ref 27.2–33.4)
MCHC: 33.5 g/dL (ref 32.0–36.0)
MCV: 90.5 fL (ref 79.3–98.0)
MONO#: 0.7 10*3/uL (ref 0.1–0.9)
MONO%: 16.7 % — ABNORMAL HIGH (ref 0.0–14.0)
NEUT%: 59 % (ref 39.0–75.0)
NEUTROS ABS: 2.5 10*3/uL (ref 1.5–6.5)
PLATELETS: 247 10*3/uL (ref 140–400)
RBC: 3.96 10*6/uL — AB (ref 4.20–5.82)
RDW: 16.8 % — AB (ref 11.0–14.6)
WBC: 4.2 10*3/uL (ref 4.0–10.3)

## 2015-03-09 MED ORDER — SODIUM CHLORIDE 0.9 % IJ SOLN
10.0000 mL | INTRAMUSCULAR | Status: DC | PRN
  Filled 2015-03-09: qty 10

## 2015-03-09 NOTE — Assessment & Plan Note (Signed)
The patient came in today to discuss the possibility of discontinuation of treatment if repeat imaging studies show complete response to treatment. I told the patient he had high-grade follicular lymphoma and had only received 3 cycles of chemotherapy so far for stage III disease. From research data, that is inadequate treatment. I told him repeat imaging study would not sway my decision to recommend 6 cycles of chemotherapy. I told him that even in the presence of complete response on PET scan, with inadequate treatment, I could not guarantee long-term remission status. If his cancer recurred in the future, it would likely recur as aggressive lymphoma and he would need aggressive chemotherapy followed by bone marrow transplant in the future. The patient will elect to go home and think about this and will call me with this decision. I have not made return appointment for the patient yet and will counsel his treatment plan today.

## 2015-03-09 NOTE — Progress Notes (Signed)
[facility] OFFICE PROGRESS NOTE  Patient Care Team: Leighton Ruff, MD as PCP - General (Family Medicine) Melissa Montane, MD as Consulting Physician (Otolaryngology) Heath Lark, MD as Consulting Physician (Hematology and Oncology)  SUMMARY OF ONCOLOGIC HISTORY: Oncology History   Follicular lymphoma grade 3a   Staging form: Lymphoid Neoplasms, AJCC 6th Edition     Clinical stage from 12/27/2014: Stage III - Signed by Heath Lark, MD on 6/81/1572        Follicular lymphoma grade 3a   10/31/2014 Pathology Results Accession: [id number] FNA showed atypical lymphoid population   11/28/2014 Pathology Results Accession: [id number] Biopsy showed high grade follicular lymphoma   4/68/0321 Surgery He had open biopsy of left neck mass   12/22/2014 Imaging [facility] showed normal EF   12/22/2014 Imaging PET scan showed stage III disease   12/22/2014 Bone Marrow Biopsy Bone marrow biopsy was negative   01/05/2015 -  Chemotherapy He received R- CHOP chemotherapy   02/14/2015 Imaging PEt CT scan showed near complete response to Rx    INTERVAL HISTORY: Please see below for problem oriented charting. He feels well. Denies recent side effects of treatment. The patient expressed desire to stop chemotherapy. REVIEW OF SYSTEMS:   Constitutional: Denies fevers, chills or abnormal weight loss Eyes: Denies blurriness of vision Ears, nose, mouth, throat, and face: Denies mucositis or sore throat Respiratory: Denies cough, dyspnea or wheezes Cardiovascular: Denies palpitation, chest discomfort or lower extremity swelling Gastrointestinal:  Denies nausea, heartburn or change in bowel habits Skin: Denies abnormal skin rashes Lymphatics: Denies new lymphadenopathy or easy bruising Neurological:Denies numbness, tingling or new weaknesses Behavioral/Psych: Mood is stable, no new changes  All other systems were reviewed with the patient and are negative.  I have reviewed the past medical history, past  surgical history, social history and family history with the patient and they are unchanged from previous note.  ALLERGIES:  is allergic to allopurinol.  MEDICATIONS:  Current Outpatient Prescriptions  Medication Sig Dispense Refill  . lidocaine-prilocaine (EMLA) cream Apply to affected area once 30 g 3  . ondansetron (ZOFRAN) 8 MG tablet Take 1 tablet (8 mg total) by mouth every 8 (eight) hours as needed. 30 tablet 1  . predniSONE (DELTASONE) 20 MG tablet Take 3 tablets (60 mg total) by mouth daily with breakfast. Take on days 2-5 of chemotherapy. 60 tablet 0  . prochlorperazine (COMPAZINE) 10 MG tablet Take 1 tablet (10 mg total) by mouth every 6 (six) hours as needed (Nausea or vomiting). 30 tablet 6   No current facility-administered medications for this visit.   Facility-Administered Medications Ordered in Other Visits  Medication Dose Route Frequency Provider Last Rate Last Dose  . sodium chloride 0.9 % injection 10 mL  10 mL Intravenous PRN Heath Lark, MD   10 mL at 01/26/15 1346  . sodium chloride 0.9 % injection 10 mL  10 mL Intravenous PRN Heath Lark, MD        PHYSICAL EXAMINATION: ECOG PERFORMANCE STATUS: 0 - Asymptomatic  Filed Vitals:   03/09/15 1002  BP: 135/74  Pulse: 56  Temp: 97.5 F (36.4 C)  Resp: 18   Filed Weights   03/09/15 1002  Weight: 140 lb 3.2 oz (63.594 kg)    GENERAL:alert, no distress and comfortable SKIN: skin color, texture, turgor are normal, no rashes or significant lesions EYES: normal, Conjunctiva are pink and non-injected, sclera clear Musculoskeletal:no cyanosis of digits and no clubbing  NEURO: alert & oriented x 3 with fluent speech, no  focal motor/sensory deficits  LABORATORY DATA:  I have reviewed the data as listed    Component Value Date/Time   NA 142 02/15/2015 1016   NA 138 06/13/2010 1000   K 4.0 02/15/2015 1016   K 4.0 06/13/2010 1000   CL 100 06/13/2010 1000   CO2 26 02/15/2015 1016   CO2 30 06/13/2010 1000    GLUCOSE 86 02/15/2015 1016   GLUCOSE 126* 06/13/2010 1000   BUN 12.7 02/15/2015 1016   BUN 4* 06/13/2010 1000   CREATININE 0.8 02/15/2015 1016   CREATININE 0.75 06/13/2010 1000   CALCIUM 9.3 02/15/2015 1016   CALCIUM 8.4 06/13/2010 1000   PROT 6.1* 02/15/2015 1016   PROT 5.9* 06/13/2010 1000   ALBUMIN 4.1 02/15/2015 1016   ALBUMIN 2.8* 06/13/2010 1000   AST 14 02/15/2015 1016   AST 109* 06/13/2010 1000   ALT 16 02/15/2015 1016   ALT 217* 06/13/2010 1000   ALKPHOS 77 02/15/2015 1016   ALKPHOS 211* 06/13/2010 1000   BILITOT 0.44 02/15/2015 1016   BILITOT 0.8 06/13/2010 1000   GFRNONAA >60 06/13/2010 1000   GFRAA  06/13/2010 1000    >60        The eGFR has been calculated using the MDRD equation. This calculation has not been validated in all clinical situations. eGFR's persistently <60 mL/min signify possible Chronic Kidney Disease.    No results found for: SPEP, UPEP  Lab Results  Component Value Date   WBC 4.2 03/09/2015   NEUTROABS 2.5 03/09/2015   HGB 12.0* 03/09/2015   HCT 35.9* 03/09/2015   MCV 90.5 03/09/2015   PLT 247 03/09/2015      Chemistry      Component Value Date/Time   NA 142 02/15/2015 1016   NA 138 06/13/2010 1000   K 4.0 02/15/2015 1016   K 4.0 06/13/2010 1000   CL 100 06/13/2010 1000   CO2 26 02/15/2015 1016   CO2 30 06/13/2010 1000   BUN 12.7 02/15/2015 1016   BUN 4* 06/13/2010 1000   CREATININE 0.8 02/15/2015 1016   CREATININE 0.75 06/13/2010 1000      Component Value Date/Time   CALCIUM 9.3 02/15/2015 1016   CALCIUM 8.4 06/13/2010 1000   ALKPHOS 77 02/15/2015 1016   ALKPHOS 211* 06/13/2010 1000   AST 14 02/15/2015 1016   AST 109* 06/13/2010 1000   ALT 16 02/15/2015 1016   ALT 217* 06/13/2010 1000   BILITOT 0.44 02/15/2015 1016   BILITOT 0.8 06/13/2010 1000     ASSESSMENT & PLAN:  Follicular lymphoma grade 3a The patient came in today to discuss the possibility of discontinuation of treatment if repeat imaging studies show  complete response to treatment. I told the patient he had high-grade follicular lymphoma and had only received 3 cycles of chemotherapy so far for stage III disease. From research data, that is inadequate treatment. I told him repeat imaging study would not sway my decision to recommend 6 cycles of chemotherapy. I told him that even in the presence of complete response on PET scan, with inadequate treatment, I could not guarantee long-term remission status. If his cancer recurred in the future, it would likely recur as aggressive lymphoma and he would need aggressive chemotherapy followed by bone marrow transplant in the future. The patient will elect to go home and think about this and will call me with this decision. I have not made return appointment for the patient yet and will counsel his treatment plan today.   No  orders of the defined types were placed in this encounter.   All questions were answered. The patient knows to call the clinic with any problems, questions or concerns. No barriers to learning was detected. I spent 10 minutes counseling the patient face to face. The total time spent in the appointment was 15 minutes and more than 50% was on counseling and review of test results     [facility], [place], MD 03/09/2015 10:13 AM

## 2015-03-09 NOTE — Patient Instructions (Signed)

## 2015-03-20 ENCOUNTER — Telehealth: Payer: Self-pay | Admitting: Hematology and Oncology

## 2015-03-20 ENCOUNTER — Other Ambulatory Visit: Payer: Self-pay | Admitting: Hematology and Oncology

## 2015-03-20 ENCOUNTER — Telehealth: Payer: Self-pay | Admitting: *Deleted

## 2015-03-20 ENCOUNTER — Encounter: Payer: Self-pay | Admitting: Hematology and Oncology

## 2015-03-20 NOTE — Telephone Encounter (Signed)
s.w. pt and advised on Sept appt pt ok and aware °

## 2015-03-20 NOTE — Telephone Encounter (Signed)
Pt states next Friday will be fine

## 2015-03-20 NOTE — Telephone Encounter (Signed)
-----   Message from Heath Lark, MD sent at 03/20/2015 11:35 AM EDT ----- Regarding: chemo This patient wanted to stop Rx in the last visit Today he emailed me and wants to resume He usually like Fridays but this Friday is full Can you call to see if he would accept a different day next week or next Friday? I will put POF for future returns and others

## 2015-03-26 ENCOUNTER — Encounter: Payer: Self-pay | Admitting: Hematology and Oncology

## 2015-03-30 ENCOUNTER — Ambulatory Visit (HOSPITAL_BASED_OUTPATIENT_CLINIC_OR_DEPARTMENT_OTHER): Payer: 59

## 2015-03-30 ENCOUNTER — Other Ambulatory Visit (HOSPITAL_BASED_OUTPATIENT_CLINIC_OR_DEPARTMENT_OTHER): Payer: 59

## 2015-03-30 VITALS — BP 128/77 | HR 73 | Temp 98.5°F | Resp 18

## 2015-03-30 DIAGNOSIS — C8230 Follicular lymphoma grade IIIa, unspecified site: Secondary | ICD-10-CM

## 2015-03-30 DIAGNOSIS — Z5112 Encounter for antineoplastic immunotherapy: Secondary | ICD-10-CM

## 2015-03-30 DIAGNOSIS — Z5189 Encounter for other specified aftercare: Secondary | ICD-10-CM | POA: Diagnosis not present

## 2015-03-30 DIAGNOSIS — Z5111 Encounter for antineoplastic chemotherapy: Secondary | ICD-10-CM | POA: Diagnosis not present

## 2015-03-30 LAB — COMPREHENSIVE METABOLIC PANEL (CC13)
ALT: 10 U/L (ref 0–55)
AST: 13 U/L (ref 5–34)
Albumin: 4.1 g/dL (ref 3.5–5.0)
Alkaline Phosphatase: 73 U/L (ref 40–150)
Anion Gap: 7 mEq/L (ref 3–11)
BILIRUBIN TOTAL: 0.74 mg/dL (ref 0.20–1.20)
BUN: 15.1 mg/dL (ref 7.0–26.0)
CO2: 27 meq/L (ref 22–29)
Calcium: 9.1 mg/dL (ref 8.4–10.4)
Chloride: 107 mEq/L (ref 98–109)
Creatinine: 0.8 mg/dL (ref 0.7–1.3)
GLUCOSE: 83 mg/dL (ref 70–140)
Potassium: 4.2 mEq/L (ref 3.5–5.1)
SODIUM: 142 meq/L (ref 136–145)
TOTAL PROTEIN: 6.2 g/dL — AB (ref 6.4–8.3)

## 2015-03-30 LAB — CBC WITH DIFFERENTIAL/PLATELET
BASO%: 0.4 % (ref 0.0–2.0)
Basophils Absolute: 0 10*3/uL (ref 0.0–0.1)
EOS%: 4.1 % (ref 0.0–7.0)
Eosinophils Absolute: 0.2 10*3/uL (ref 0.0–0.5)
HCT: 39 % (ref 38.4–49.9)
HEMOGLOBIN: 13 g/dL (ref 13.0–17.1)
LYMPH%: 23.9 % (ref 14.0–49.0)
MCH: 30.5 pg (ref 27.2–33.4)
MCHC: 33.3 g/dL (ref 32.0–36.0)
MCV: 91.5 fL (ref 79.3–98.0)
MONO#: 0.4 10*3/uL (ref 0.1–0.9)
MONO%: 8.6 % (ref 0.0–14.0)
NEUT%: 63 % (ref 39.0–75.0)
NEUTROS ABS: 2.9 10*3/uL (ref 1.5–6.5)
Platelets: 207 10*3/uL (ref 140–400)
RBC: 4.26 10*6/uL (ref 4.20–5.82)
RDW: 14.5 % (ref 11.0–14.6)
WBC: 4.6 10*3/uL (ref 4.0–10.3)
lymph#: 1.1 10*3/uL (ref 0.9–3.3)

## 2015-03-30 MED ORDER — SODIUM CHLORIDE 0.9 % IV SOLN
2.0000 mg | Freq: Once | INTRAVENOUS | Status: AC
  Administered 2015-03-30: 2 mg via INTRAVENOUS
  Filled 2015-03-30: qty 2

## 2015-03-30 MED ORDER — SODIUM CHLORIDE 0.9 % IV SOLN
Freq: Once | INTRAVENOUS | Status: AC
  Administered 2015-03-30: 10:00:00 via INTRAVENOUS
  Filled 2015-03-30: qty 8

## 2015-03-30 MED ORDER — RITUXIMAB CHEMO INJECTION 500 MG/50ML
375.0000 mg/m2 | Freq: Once | INTRAVENOUS | Status: AC
  Administered 2015-03-30: 700 mg via INTRAVENOUS
  Filled 2015-03-30: qty 70

## 2015-03-30 MED ORDER — DIPHENHYDRAMINE HCL 25 MG PO CAPS
50.0000 mg | ORAL_CAPSULE | Freq: Once | ORAL | Status: AC
  Administered 2015-03-30: 50 mg via ORAL

## 2015-03-30 MED ORDER — ACETAMINOPHEN 325 MG PO TABS
ORAL_TABLET | ORAL | Status: AC
  Filled 2015-03-30: qty 2

## 2015-03-30 MED ORDER — SODIUM CHLORIDE 0.9 % IV SOLN
Freq: Once | INTRAVENOUS | Status: AC
  Administered 2015-03-30: 09:00:00 via INTRAVENOUS

## 2015-03-30 MED ORDER — SODIUM CHLORIDE 0.9 % IJ SOLN
10.0000 mL | INTRAMUSCULAR | Status: DC | PRN
  Filled 2015-03-30: qty 10

## 2015-03-30 MED ORDER — SODIUM CHLORIDE 0.9 % IV SOLN
750.0000 mg/m2 | Freq: Once | INTRAVENOUS | Status: AC
  Administered 2015-03-30: 1340 mg via INTRAVENOUS
  Filled 2015-03-30: qty 67

## 2015-03-30 MED ORDER — PEGFILGRASTIM 6 MG/0.6ML ~~LOC~~ PSKT
6.0000 mg | PREFILLED_SYRINGE | Freq: Once | SUBCUTANEOUS | Status: AC
  Administered 2015-03-30: 6 mg via SUBCUTANEOUS
  Filled 2015-03-30: qty 0.6

## 2015-03-30 MED ORDER — ACETAMINOPHEN 325 MG PO TABS
650.0000 mg | ORAL_TABLET | Freq: Once | ORAL | Status: AC
  Administered 2015-03-30: 650 mg via ORAL

## 2015-03-30 MED ORDER — DIPHENHYDRAMINE HCL 25 MG PO CAPS
ORAL_CAPSULE | ORAL | Status: AC
  Filled 2015-03-30: qty 2

## 2015-03-30 MED ORDER — DOXORUBICIN HCL CHEMO IV INJECTION 2 MG/ML
50.0000 mg/m2 | Freq: Once | INTRAVENOUS | Status: AC
  Administered 2015-03-30: 90 mg via INTRAVENOUS
  Filled 2015-03-30: qty 45

## 2015-03-30 NOTE — Patient Instructions (Signed)
[place] Discharge Instructions for Patients Receiving Chemotherapy  Today you received the following chemotherapy agents Rituxan, Vincristine, Adriamycin, Cytoxan  To help prevent nausea and vomiting after your treatment, we encourage you to take your nausea medication as directed.   If you develop nausea and vomiting that is not controlled by your nausea medication, call the clinic.   BELOW ARE SYMPTOMS THAT SHOULD BE REPORTED IMMEDIATELY:  *FEVER GREATER THAN 100.5 F  *CHILLS WITH OR WITHOUT FEVER  NAUSEA AND VOMITING THAT IS NOT CONTROLLED WITH YOUR NAUSEA MEDICATION  *UNUSUAL SHORTNESS OF BREATH  *UNUSUAL BRUISING OR BLEEDING  TENDERNESS IN MOUTH AND THROAT WITH OR WITHOUT PRESENCE OF ULCERS  *URINARY PROBLEMS  *BOWEL PROBLEMS  UNUSUAL RASH Items with * indicate a potential emergency and should be followed up as soon as possible.  Feel free to call the clinic you have any questions or concerns. The clinic phone number is [phone].  Please show the [place] at check-in to the Emergency Department and triage nurse.

## 2015-04-02 ENCOUNTER — Ambulatory Visit: Payer: 59

## 2015-04-19 ENCOUNTER — Ambulatory Visit (HOSPITAL_BASED_OUTPATIENT_CLINIC_OR_DEPARTMENT_OTHER): Payer: 59 | Admitting: Hematology and Oncology

## 2015-04-19 ENCOUNTER — Ambulatory Visit (HOSPITAL_BASED_OUTPATIENT_CLINIC_OR_DEPARTMENT_OTHER): Payer: 59

## 2015-04-19 ENCOUNTER — Telehealth: Payer: Self-pay | Admitting: Hematology and Oncology

## 2015-04-19 ENCOUNTER — Other Ambulatory Visit (HOSPITAL_BASED_OUTPATIENT_CLINIC_OR_DEPARTMENT_OTHER): Payer: 59

## 2015-04-19 ENCOUNTER — Encounter: Payer: Self-pay | Admitting: Hematology and Oncology

## 2015-04-19 VITALS — BP 125/67 | HR 73 | Temp 97.8°F | Resp 18

## 2015-04-19 VITALS — BP 120/67 | HR 81 | Temp 98.2°F | Resp 18 | Ht 67.0 in | Wt 137.4 lb

## 2015-04-19 DIAGNOSIS — Z5189 Encounter for other specified aftercare: Secondary | ICD-10-CM | POA: Diagnosis not present

## 2015-04-19 DIAGNOSIS — Z5111 Encounter for antineoplastic chemotherapy: Secondary | ICD-10-CM | POA: Diagnosis not present

## 2015-04-19 DIAGNOSIS — C8231 Follicular lymphoma grade IIIa, lymph nodes of head, face, and neck: Secondary | ICD-10-CM

## 2015-04-19 DIAGNOSIS — G620 Drug-induced polyneuropathy: Secondary | ICD-10-CM

## 2015-04-19 DIAGNOSIS — C8230 Follicular lymphoma grade IIIa, unspecified site: Secondary | ICD-10-CM

## 2015-04-19 DIAGNOSIS — K047 Periapical abscess without sinus: Secondary | ICD-10-CM

## 2015-04-19 DIAGNOSIS — K0530 Chronic periodontitis, unspecified: Secondary | ICD-10-CM

## 2015-04-19 DIAGNOSIS — M542 Cervicalgia: Secondary | ICD-10-CM

## 2015-04-19 DIAGNOSIS — D61810 Antineoplastic chemotherapy induced pancytopenia: Secondary | ICD-10-CM

## 2015-04-19 DIAGNOSIS — T451X5A Adverse effect of antineoplastic and immunosuppressive drugs, initial encounter: Secondary | ICD-10-CM | POA: Insufficient documentation

## 2015-04-19 HISTORY — DX: Periapical abscess without sinus: K04.7

## 2015-04-19 LAB — CBC WITH DIFFERENTIAL/PLATELET
BASO%: 0.8 % (ref 0.0–2.0)
Basophils Absolute: 0 10*3/uL (ref 0.0–0.1)
EOS ABS: 0.1 10*3/uL (ref 0.0–0.5)
EOS%: 1.5 % (ref 0.0–7.0)
HCT: 38.3 % — ABNORMAL LOW (ref 38.4–49.9)
HEMOGLOBIN: 12.8 g/dL — AB (ref 13.0–17.1)
LYMPH%: 21.3 % (ref 14.0–49.0)
MCH: 30.8 pg (ref 27.2–33.4)
MCHC: 33.6 g/dL (ref 32.0–36.0)
MCV: 91.9 fL (ref 79.3–98.0)
MONO#: 0.5 10*3/uL (ref 0.1–0.9)
MONO%: 13.8 % (ref 0.0–14.0)
NEUT%: 62.6 % (ref 39.0–75.0)
NEUTROS ABS: 2.3 10*3/uL (ref 1.5–6.5)
PLATELETS: 343 10*3/uL (ref 140–400)
RBC: 4.17 10*6/uL — ABNORMAL LOW (ref 4.20–5.82)
RDW: 14.8 % — AB (ref 11.0–14.6)
WBC: 3.8 10*3/uL — AB (ref 4.0–10.3)
lymph#: 0.8 10*3/uL — ABNORMAL LOW (ref 0.9–3.3)

## 2015-04-19 LAB — COMPREHENSIVE METABOLIC PANEL (CC13)
ALBUMIN: 4.3 g/dL (ref 3.5–5.0)
ALK PHOS: 80 U/L (ref 40–150)
ALT: 13 U/L (ref 0–55)
AST: 14 U/L (ref 5–34)
Anion Gap: 5 mEq/L (ref 3–11)
BILIRUBIN TOTAL: 0.48 mg/dL (ref 0.20–1.20)
BUN: 10.6 mg/dL (ref 7.0–26.0)
CO2: 30 mEq/L — ABNORMAL HIGH (ref 22–29)
CREATININE: 0.8 mg/dL (ref 0.7–1.3)
Calcium: 9.5 mg/dL (ref 8.4–10.4)
Chloride: 105 mEq/L (ref 98–109)
GLUCOSE: 96 mg/dL (ref 70–140)
Potassium: 3.9 mEq/L (ref 3.5–5.1)
SODIUM: 141 meq/L (ref 136–145)
TOTAL PROTEIN: 6.5 g/dL (ref 6.4–8.3)

## 2015-04-19 MED ORDER — VINCRISTINE SULFATE CHEMO INJECTION 1 MG/ML
1.0000 mg | Freq: Once | INTRAVENOUS | Status: AC
  Administered 2015-04-19: 1 mg via INTRAVENOUS
  Filled 2015-04-19: qty 1

## 2015-04-19 MED ORDER — SODIUM CHLORIDE 0.9 % IJ SOLN
10.0000 mL | INTRAMUSCULAR | Status: DC | PRN
  Administered 2015-04-19: 10 mL
  Filled 2015-04-19: qty 10

## 2015-04-19 MED ORDER — SODIUM CHLORIDE 0.9 % IV SOLN
Freq: Once | INTRAVENOUS | Status: AC
  Administered 2015-04-19: 11:00:00 via INTRAVENOUS
  Filled 2015-04-19: qty 8

## 2015-04-19 MED ORDER — DOXORUBICIN HCL CHEMO IV INJECTION 2 MG/ML
50.0000 mg/m2 | Freq: Once | INTRAVENOUS | Status: AC
  Administered 2015-04-19: 90 mg via INTRAVENOUS
  Filled 2015-04-19: qty 45

## 2015-04-19 MED ORDER — PEGFILGRASTIM 6 MG/0.6ML ~~LOC~~ PSKT
6.0000 mg | PREFILLED_SYRINGE | Freq: Once | SUBCUTANEOUS | Status: AC
  Administered 2015-04-19: 6 mg via SUBCUTANEOUS
  Filled 2015-04-19: qty 0.6

## 2015-04-19 MED ORDER — DIPHENHYDRAMINE HCL 25 MG PO CAPS
50.0000 mg | ORAL_CAPSULE | Freq: Once | ORAL | Status: AC
Start: 2015-04-19 — End: 2015-04-19
  Administered 2015-04-19: 50 mg via ORAL

## 2015-04-19 MED ORDER — SODIUM CHLORIDE 0.9 % IV SOLN
Freq: Once | INTRAVENOUS | Status: AC
  Administered 2015-04-19: 11:00:00 via INTRAVENOUS

## 2015-04-19 MED ORDER — SODIUM CHLORIDE 0.9 % IV SOLN
750.0000 mg/m2 | Freq: Once | INTRAVENOUS | Status: AC
  Administered 2015-04-19: 1340 mg via INTRAVENOUS
  Filled 2015-04-19: qty 67

## 2015-04-19 MED ORDER — AMOXICILLIN-POT CLAVULANATE 875-125 MG PO TABS
1.0000 | ORAL_TABLET | Freq: Two times a day (BID) | ORAL | Status: DC
Start: 2015-04-19 — End: 2015-06-08

## 2015-04-19 MED ORDER — HEPARIN SOD (PORK) LOCK FLUSH 100 UNIT/ML IV SOLN
500.0000 [IU] | Freq: Once | INTRAVENOUS | Status: AC | PRN
  Administered 2015-04-19: 500 [IU]
  Filled 2015-04-19: qty 5

## 2015-04-19 MED ORDER — DIPHENHYDRAMINE HCL 25 MG PO CAPS
ORAL_CAPSULE | ORAL | Status: AC
  Filled 2015-04-19: qty 1

## 2015-04-19 MED ORDER — SODIUM CHLORIDE 0.9 % IV SOLN
375.0000 mg/m2 | Freq: Once | INTRAVENOUS | Status: AC
  Administered 2015-04-19: 700 mg via INTRAVENOUS
  Filled 2015-04-19: qty 70

## 2015-04-19 MED ORDER — ACETAMINOPHEN 325 MG PO TABS
ORAL_TABLET | ORAL | Status: AC
  Filled 2015-04-19: qty 2

## 2015-04-19 MED ORDER — ACETAMINOPHEN 325 MG PO TABS
650.0000 mg | ORAL_TABLET | Freq: Once | ORAL | Status: AC
  Administered 2015-04-19: 650 mg via ORAL

## 2015-04-19 NOTE — Telephone Encounter (Signed)
Gave adn printed appt sched and avs fo rpt for NOV °

## 2015-04-19 NOTE — Assessment & Plan Note (Signed)
He has new acute flare of left neck pain. I suspect it could be neuropathic pain. I recommend a trial of lidocaine cream over the area of prior biopsy site

## 2015-04-19 NOTE — Patient Instructions (Signed)
[place] Discharge Instructions for Patients Receiving Chemotherapy  Today you received the following chemotherapy agents Rituxan/Adriamycin/Vincristine/Cytoxan  To help prevent nausea and vomiting after your treatment, we encourage you to take your nausea medication as prescribed.  If you develop nausea and vomiting that is not controlled by your nausea medication, call the clinic.   BELOW ARE SYMPTOMS THAT SHOULD BE REPORTED IMMEDIATELY:  *FEVER GREATER THAN 100.5 F  *CHILLS WITH OR WITHOUT FEVER  NAUSEA AND VOMITING THAT IS NOT CONTROLLED WITH YOUR NAUSEA MEDICATION  *UNUSUAL SHORTNESS OF BREATH  *UNUSUAL BRUISING OR BLEEDING  TENDERNESS IN MOUTH AND THROAT WITH OR WITHOUT PRESENCE OF ULCERS  *URINARY PROBLEMS  *BOWEL PROBLEMS  UNUSUAL RASH Items with * indicate a potential emergency and should be followed up as soon as possible.  Feel free to call the clinic you have any questions or concerns. The clinic phone number is [phone].  Please show the [place] at check-in to the Emergency Department and triage nurse.

## 2015-04-19 NOTE — Assessment & Plan Note (Signed)
This is likely due to recent treatment. The patient denies recent history of bleeding such as epistaxis, hematuria or hematochezia. He is asymptomatic from the anemia. I will observe for now.  He does not require transfusion now. I will continue the chemotherapy at current dose without dosage adjustment.  If the anemia gets progressive worse in the future, I might have to delay his treatment or adjust the chemotherapy dose.  

## 2015-04-19 NOTE — Progress Notes (Signed)
Adriamycin administered through a free dripping normal saline line. Positive blood return noted before, every 5 mL's during and after adriamycin administration. Patient tolerated well.

## 2015-04-19 NOTE — Progress Notes (Signed)
[facility] OFFICE PROGRESS NOTE  Patient Care Team: Leighton Ruff, MD as PCP - General (Family Medicine) Melissa Montane, MD as Consulting Physician (Otolaryngology) Heath Lark, MD as Consulting Physician (Hematology and Oncology)  SUMMARY OF ONCOLOGIC HISTORY: Oncology History   Follicular lymphoma grade 3a   Staging form: Lymphoid Neoplasms, AJCC 6th Edition     Clinical stage from 12/27/2014: Stage III - Signed by Heath Lark, MD on 9/92/4268        Follicular lymphoma grade 3a ([place])   10/31/2014 Pathology Results Accession: [id number] showed atypical lymphoid population   11/28/2014 Pathology Results Accession: [id number] Biopsy showed high grade follicular lymphoma   10/15/4079 Surgery He had open biopsy of left neck mass   12/22/2014 Imaging [facility] showed normal EF   12/22/2014 Imaging PET scan showed stage III disease   12/22/2014 Bone Marrow Biopsy Bone marrow biopsy was negative   01/05/2015 -  Chemotherapy He received R- CHOP chemotherapy   02/14/2015 Imaging PEt CT scan showed near complete response to Rx   04/19/2015 Adverse Reaction  he has peripheral neuropathy. Dose of vincristine is reduced    INTERVAL HISTORY: Please see below for problem oriented charting. He returns prior to cycle 5 of treatment. He complained of peripheral neuropathy and worsening pain in the left side of the neck from prior biopsy. Denies mucositis. No nausea or vomiting.  He also complained of new tooth pain. Denies Neck Swelling around the Jaw  REVIEW OF SYSTEMS:   Constitutional: Denies fevers, chills or abnormal weight loss Eyes: Denies blurriness of vision Ears, nose, mouth, throat, and face: Denies mucositis or sore throat Respiratory: Denies cough, dyspnea or wheezes Cardiovascular: Denies palpitation, chest discomfort or lower extremity swelling Gastrointestinal:  Denies nausea, heartburn or change in bowel habits Skin: Denies abnormal skin rashes Lymphatics: Denies new  lymphadenopathy or easy bruising Neurological:Denies numbness, tingling or new weaknesses Behavioral/Psych: Mood is stable, no new changes  All other systems were reviewed with the patient and are negative.  I have reviewed the past medical history, past surgical history, social history and family history with the patient and they are unchanged from previous note.  ALLERGIES:  is allergic to allopurinol.  MEDICATIONS:  Current Outpatient Prescriptions  Medication Sig Dispense Refill  . lidocaine-prilocaine (EMLA) cream Apply to affected area once 30 g 3  . ondansetron (ZOFRAN) 8 MG tablet Take 1 tablet (8 mg total) by mouth every 8 (eight) hours as needed. 30 tablet 1  . predniSONE (DELTASONE) 20 MG tablet Take 3 tablets (60 mg total) by mouth daily with breakfast. Take on days 2-5 of chemotherapy. 60 tablet 0  . prochlorperazine (COMPAZINE) 10 MG tablet Take 1 tablet (10 mg total) by mouth every 6 (six) hours as needed (Nausea or vomiting). 30 tablet 6  . amoxicillin-clavulanate (AUGMENTIN) 875-125 MG tablet Take 1 tablet by mouth 2 (two) times daily. 14 tablet 0   No current facility-administered medications for this visit.   Facility-Administered Medications Ordered in Other Visits  Medication Dose Route Frequency Provider Last Rate Last Dose  . 0.9 %  sodium chloride infusion   Intravenous Once Heath Lark, MD      . acetaminophen (TYLENOL) tablet 650 mg  650 mg Oral Once Heath Lark, MD      . cyclophosphamide (CYTOXAN) 1,340 mg in sodium chloride 0.9 % 250 mL chemo infusion  750 mg/m2 (Treatment Plan Actual) Intravenous Once Heath Lark, MD      . diphenhydrAMINE (BENADRYL) capsule 50 mg  50 mg Oral Once Heath Lark, MD      . DOXOrubicin (ADRIAMYCIN) chemo injection 90 mg  50 mg/m2 (Treatment Plan Actual) Intravenous Once Heath Lark, MD      . heparin lock flush 100 unit/mL  500 Units Intracatheter Once PRN Heath Lark, MD      . ondansetron (ZOFRAN) 16 mg, dexamethasone (DECADRON) 20  mg in sodium chloride 0.9 % 50 mL IVPB   Intravenous Once Heath Lark, MD      . pegfilgrastim (NEULASTA ONPRO KIT) injection 6 mg  6 mg Subcutaneous Once Heath Lark, MD      . riTUXimab (RITUXAN) 700 mg in sodium chloride 0.9 % 180 mL chemo infusion  375 mg/m2 (Treatment Plan Actual) Intravenous Once Heath Lark, MD      . sodium chloride 0.9 % injection 10 mL  10 mL Intravenous PRN Heath Lark, MD   10 mL at 01/26/15 1346  . sodium chloride 0.9 % injection 10 mL  10 mL Intravenous PRN Jarrod Mcenery, MD      . sodium chloride 0.9 % injection 10 mL  10 mL Intracatheter PRN Aldora [facility], MD      . vinCRIStine (ONCOVIN) 1 mg in sodium chloride 0.9 % 50 mL chemo infusion  1 mg Intravenous Once Heath Lark, MD        PHYSICAL EXAMINATION: ECOG PERFORMANCE STATUS: 1 - Symptomatic but completely ambulatory  Filed Vitals:   04/19/15 0954  BP: 120/67  Pulse: 81  Temp: 98.2 F (36.8 C)  Resp: 18   Filed Weights   04/19/15 0954  Weight: 137 lb 6.4 oz (62.324 kg)    GENERAL:alert, no distress and comfortable SKIN: skin color, texture, turgor are normal, no rashes or significant lesions EYES: normal, Conjunctiva are pink and non-injected, sclera clear OROPHARYNX:no exudate, no erythema and lips, buccal mucosa, and tongue normal . Noted poor dentition NECK: supple, thyroid normal size, non-tender, without nodularity LYMPH:  no palpable lymphadenopathy in the cervical, axillary or inguinal LUNGS: clear to auscultation and percussion with normal breathing effort HEART: regular rate & rhythm and no murmurs and no lower extremity edema ABDOMEN:abdomen soft, non-tender and normal bowel sounds Musculoskeletal:no cyanosis of digits and no clubbing  NEURO: alert & oriented x 3 with fluent speech, no focal motor/sensory deficits  LABORATORY DATA:  I have reviewed the data as listed    Component Value Date/Time   NA 141 04/19/2015 0943   NA 138 06/13/2010 1000   K 3.9 04/19/2015 0943   K 4.0 06/13/2010  1000   CL 100 06/13/2010 1000   CO2 30* 04/19/2015 0943   CO2 30 06/13/2010 1000   GLUCOSE 96 04/19/2015 0943   GLUCOSE 126* 06/13/2010 1000   BUN 10.6 04/19/2015 0943   BUN 4* 06/13/2010 1000   CREATININE 0.8 04/19/2015 0943   CREATININE 0.75 06/13/2010 1000   CALCIUM 9.5 04/19/2015 0943   CALCIUM 8.4 06/13/2010 1000   PROT 6.5 04/19/2015 0943   PROT 5.9* 06/13/2010 1000   ALBUMIN 4.3 04/19/2015 0943   ALBUMIN 2.8* 06/13/2010 1000   AST 14 04/19/2015 0943   AST 109* 06/13/2010 1000   ALT 13 04/19/2015 0943   ALT 217* 06/13/2010 1000   ALKPHOS 80 04/19/2015 0943   ALKPHOS 211* 06/13/2010 1000   BILITOT 0.48 04/19/2015 0943   BILITOT 0.8 06/13/2010 1000   GFRNONAA >60 06/13/2010 1000   GFRAA  06/13/2010 1000    >60        The eGFR has  been calculated using the MDRD equation. This calculation has not been validated in all clinical situations. eGFR's persistently <60 mL/min signify possible Chronic Kidney Disease.    No results found for: SPEP, UPEP  Lab Results  Component Value Date   WBC 3.8* 04/19/2015   NEUTROABS 2.3 04/19/2015   HGB 12.8* 04/19/2015   HCT 38.3* 04/19/2015   MCV 91.9 04/19/2015   PLT 343 04/19/2015      Chemistry      Component Value Date/Time   NA 141 04/19/2015 0943   NA 138 06/13/2010 1000   K 3.9 04/19/2015 0943   K 4.0 06/13/2010 1000   CL 100 06/13/2010 1000   CO2 30* 04/19/2015 0943   CO2 30 06/13/2010 1000   BUN 10.6 04/19/2015 0943   BUN 4* 06/13/2010 1000   CREATININE 0.8 04/19/2015 0943   CREATININE 0.75 06/13/2010 1000      Component Value Date/Time   CALCIUM 9.5 04/19/2015 0943   CALCIUM 8.4 06/13/2010 1000   ALKPHOS 80 04/19/2015 0943   ALKPHOS 211* 06/13/2010 1000   AST 14 04/19/2015 0943   AST 109* 06/13/2010 1000   ALT 13 04/19/2015 0943   ALT 217* 06/13/2010 1000   BILITOT 0.48 04/19/2015 0943   BILITOT 0.8 06/13/2010 1000      ASSESSMENT & PLAN:  Follicular lymphoma grade 3a  He tolerated treatment  well up from new peripheral neuropathy. I will reduce the dose of vincristine by 50%. I will proceed with treatment today. I plan on last treatment in 3 weeks and a repeat PET scan before removal of port.  Chronic dental infection  The patient complaining of tooth pain. I do not feel that it is necessary to hold treatment. I will prescribes a course of antibiotic therapy and recommend he visits with his dentist  Neuropathy due to chemotherapeutic drug [facility])  He had mild grade 1 neuropathy. I will reduce the dose of vincristine for now.  Neck pain on left side  He has new acute flare of left neck pain. I suspect it could be neuropathic pain. I recommend a trial of lidocaine cream over the area of prior biopsy site  Pancytopenia due to antineoplastic chemotherapy [facility]) This is likely due to recent treatment. The patient denies recent history of bleeding such as epistaxis, hematuria or hematochezia. He is asymptomatic from the anemia. I will observe for now.  He does not require transfusion now. I will continue the chemotherapy at current dose without dosage adjustment.  If the anemia gets progressive worse in the future, I might have to delay his treatment or adjust the chemotherapy dose.    Orders Placed This Encounter  Procedures  . C-reactive protein    Standing Status: Future     Number of Occurrences:      Standing Expiration Date: 04/18/2016   All questions were answered. The patient knows to call the clinic with any problems, questions or concerns. No barriers to learning was detected. I spent 30 minutes counseling the patient face to face. The total time spent in the appointment was 40 minutes and more than 50% was on counseling and review of test results     [facility], [place], MD 04/19/2015 10:57 AM

## 2015-04-19 NOTE — Assessment & Plan Note (Signed)
He tolerated treatment well up from new peripheral neuropathy. I will reduce the dose of vincristine by 50%. I will proceed with treatment today. I plan on last treatment in 3 weeks and a repeat PET scan before removal of port.

## 2015-04-19 NOTE — Assessment & Plan Note (Signed)
The patient complaining of tooth pain. I do not feel that it is necessary to hold treatment. I will prescribes a course of antibiotic therapy and recommend he visits with his dentist

## 2015-04-19 NOTE — Assessment & Plan Note (Signed)
He had mild grade 1 neuropathy. I will reduce the dose of vincristine for now.

## 2015-04-21 ENCOUNTER — Ambulatory Visit: Payer: 59

## 2015-05-10 ENCOUNTER — Encounter: Payer: Self-pay | Admitting: Hematology and Oncology

## 2015-05-10 ENCOUNTER — Ambulatory Visit (HOSPITAL_BASED_OUTPATIENT_CLINIC_OR_DEPARTMENT_OTHER): Payer: 59 | Admitting: Hematology and Oncology

## 2015-05-10 ENCOUNTER — Telehealth: Payer: Self-pay | Admitting: Obstetrics & Gynecology

## 2015-05-10 ENCOUNTER — Other Ambulatory Visit (HOSPITAL_BASED_OUTPATIENT_CLINIC_OR_DEPARTMENT_OTHER): Payer: 59

## 2015-05-10 ENCOUNTER — Ambulatory Visit (HOSPITAL_BASED_OUTPATIENT_CLINIC_OR_DEPARTMENT_OTHER): Payer: 59

## 2015-05-10 VITALS — BP 112/59 | HR 74 | Temp 98.0°F | Resp 18

## 2015-05-10 VITALS — BP 120/73 | HR 71 | Temp 97.5°F | Resp 18 | Ht 67.0 in | Wt 138.5 lb

## 2015-05-10 DIAGNOSIS — C8231 Follicular lymphoma grade IIIa, lymph nodes of head, face, and neck: Secondary | ICD-10-CM | POA: Diagnosis not present

## 2015-05-10 DIAGNOSIS — Z5112 Encounter for antineoplastic immunotherapy: Secondary | ICD-10-CM

## 2015-05-10 DIAGNOSIS — T451X5A Adverse effect of antineoplastic and immunosuppressive drugs, initial encounter: Secondary | ICD-10-CM

## 2015-05-10 DIAGNOSIS — D61810 Antineoplastic chemotherapy induced pancytopenia: Secondary | ICD-10-CM | POA: Diagnosis not present

## 2015-05-10 DIAGNOSIS — Z5111 Encounter for antineoplastic chemotherapy: Secondary | ICD-10-CM

## 2015-05-10 DIAGNOSIS — K0530 Chronic periodontitis, unspecified: Secondary | ICD-10-CM | POA: Diagnosis not present

## 2015-05-10 DIAGNOSIS — Z5189 Encounter for other specified aftercare: Secondary | ICD-10-CM | POA: Diagnosis not present

## 2015-05-10 DIAGNOSIS — C8230 Follicular lymphoma grade IIIa, unspecified site: Secondary | ICD-10-CM

## 2015-05-10 DIAGNOSIS — G620 Drug-induced polyneuropathy: Secondary | ICD-10-CM

## 2015-05-10 DIAGNOSIS — K047 Periapical abscess without sinus: Secondary | ICD-10-CM

## 2015-05-10 DIAGNOSIS — C8238 Follicular lymphoma grade IIIa, lymph nodes of multiple sites: Secondary | ICD-10-CM

## 2015-05-10 LAB — CBC WITH DIFFERENTIAL/PLATELET
BASO%: 2.5 % — ABNORMAL HIGH (ref 0.0–2.0)
Basophils Absolute: 0.1 10*3/uL (ref 0.0–0.1)
EOS ABS: 0.1 10*3/uL (ref 0.0–0.5)
EOS%: 2.7 % (ref 0.0–7.0)
HCT: 38.7 % (ref 38.4–49.9)
HGB: 12.8 g/dL — ABNORMAL LOW (ref 13.0–17.1)
LYMPH%: 19.2 % (ref 14.0–49.0)
MCH: 30.5 pg (ref 27.2–33.4)
MCHC: 33 g/dL (ref 32.0–36.0)
MCV: 92.5 fL (ref 79.3–98.0)
MONO#: 0.5 10*3/uL (ref 0.1–0.9)
MONO%: 16 % — AB (ref 0.0–14.0)
NEUT%: 59.6 % (ref 39.0–75.0)
NEUTROS ABS: 2 10*3/uL (ref 1.5–6.5)
Platelets: 298 10*3/uL (ref 140–400)
RBC: 4.18 10*6/uL — AB (ref 4.20–5.82)
RDW: 14.5 % (ref 11.0–14.6)
WBC: 3.4 10*3/uL — AB (ref 4.0–10.3)
lymph#: 0.6 10*3/uL — ABNORMAL LOW (ref 0.9–3.3)

## 2015-05-10 LAB — COMPREHENSIVE METABOLIC PANEL (CC13)
ALK PHOS: 78 U/L (ref 40–150)
ALT: 13 U/L (ref 0–55)
ANION GAP: 7 meq/L (ref 3–11)
AST: 17 U/L (ref 5–34)
Albumin: 4.2 g/dL (ref 3.5–5.0)
BILIRUBIN TOTAL: 0.62 mg/dL (ref 0.20–1.20)
BUN: 13.9 mg/dL (ref 7.0–26.0)
CO2: 30 mEq/L — ABNORMAL HIGH (ref 22–29)
CREATININE: 0.8 mg/dL (ref 0.7–1.3)
Calcium: 9.7 mg/dL (ref 8.4–10.4)
Chloride: 105 mEq/L (ref 98–109)
Glucose: 87 mg/dl (ref 70–140)
POTASSIUM: 3.9 meq/L (ref 3.5–5.1)
Sodium: 142 mEq/L (ref 136–145)
Total Protein: 6.4 g/dL (ref 6.4–8.3)

## 2015-05-10 LAB — C-REACTIVE PROTEIN

## 2015-05-10 MED ORDER — DIPHENHYDRAMINE HCL 25 MG PO CAPS
50.0000 mg | ORAL_CAPSULE | Freq: Once | ORAL | Status: AC
Start: 2015-05-10 — End: 2015-05-10
  Administered 2015-05-10: 50 mg via ORAL

## 2015-05-10 MED ORDER — SODIUM CHLORIDE 0.9 % IV SOLN
Freq: Once | INTRAVENOUS | Status: AC
  Administered 2015-05-10: 11:00:00 via INTRAVENOUS

## 2015-05-10 MED ORDER — SODIUM CHLORIDE 0.9 % IV SOLN
1.0000 mg | Freq: Once | INTRAVENOUS | Status: AC
  Administered 2015-05-10: 1 mg via INTRAVENOUS
  Filled 2015-05-10: qty 1

## 2015-05-10 MED ORDER — HEPARIN SOD (PORK) LOCK FLUSH 100 UNIT/ML IV SOLN
500.0000 [IU] | Freq: Once | INTRAVENOUS | Status: AC | PRN
  Administered 2015-05-10: 500 [IU]
  Filled 2015-05-10: qty 5

## 2015-05-10 MED ORDER — SODIUM CHLORIDE 0.9 % IV SOLN
750.0000 mg/m2 | Freq: Once | INTRAVENOUS | Status: AC
  Administered 2015-05-10: 1340 mg via INTRAVENOUS
  Filled 2015-05-10: qty 67

## 2015-05-10 MED ORDER — DIPHENHYDRAMINE HCL 25 MG PO CAPS
ORAL_CAPSULE | ORAL | Status: AC
  Filled 2015-05-10: qty 2

## 2015-05-10 MED ORDER — ACETAMINOPHEN 325 MG PO TABS
650.0000 mg | ORAL_TABLET | Freq: Once | ORAL | Status: AC
  Administered 2015-05-10: 650 mg via ORAL

## 2015-05-10 MED ORDER — SODIUM CHLORIDE 0.9 % IV SOLN
375.0000 mg/m2 | Freq: Once | INTRAVENOUS | Status: AC
  Administered 2015-05-10: 700 mg via INTRAVENOUS
  Filled 2015-05-10: qty 70

## 2015-05-10 MED ORDER — DOXORUBICIN HCL CHEMO IV INJECTION 2 MG/ML
50.0000 mg/m2 | Freq: Once | INTRAVENOUS | Status: AC
  Administered 2015-05-10: 90 mg via INTRAVENOUS
  Filled 2015-05-10: qty 45

## 2015-05-10 MED ORDER — SODIUM CHLORIDE 0.9 % IJ SOLN
10.0000 mL | INTRAMUSCULAR | Status: DC | PRN
  Administered 2015-05-10: 10 mL
  Filled 2015-05-10: qty 10

## 2015-05-10 MED ORDER — ACETAMINOPHEN 325 MG PO TABS
ORAL_TABLET | ORAL | Status: AC
  Filled 2015-05-10: qty 2

## 2015-05-10 MED ORDER — PEGFILGRASTIM 6 MG/0.6ML ~~LOC~~ PSKT
6.0000 mg | PREFILLED_SYRINGE | Freq: Once | SUBCUTANEOUS | Status: AC
  Administered 2015-05-10: 6 mg via SUBCUTANEOUS
  Filled 2015-05-10: qty 0.6

## 2015-05-10 MED ORDER — SODIUM CHLORIDE 0.9 % IV SOLN
Freq: Once | INTRAVENOUS | Status: AC
  Administered 2015-05-10: 12:00:00 via INTRAVENOUS
  Filled 2015-05-10: qty 8

## 2015-05-10 NOTE — Assessment & Plan Note (Signed)
This is likely due to recent treatment. The patient denies recent history of bleeding such as epistaxis, hematuria or hematochezia. He is asymptomatic from the anemia. I will observe for now.  He does not require transfusion now. I will continue the chemotherapy at current dose without dosage adjustment.  If the anemia gets progressive worse in the future, I might have to delay his treatment or adjust the chemotherapy dose.  

## 2015-05-10 NOTE — Assessment & Plan Note (Signed)
The patient complaining of tooth pain. I do not feel that it is necessary to hold treatment. I prescribed a course of antibiotics but they cause nausea. The patient has elected to hold off further treatment until his chemotherapy is completed.

## 2015-05-10 NOTE — Assessment & Plan Note (Signed)
He had mild grade 1 neuropathy. I will reduce the dose of vincristine for now. 

## 2015-05-10 NOTE — Assessment & Plan Note (Signed)
He tolerated treatment well up from new peripheral neuropathy. I will reduce the dose of vincristine by 50%. I will proceed with treatment today. I plan on repeat PET scan next month before removal of port.

## 2015-05-10 NOTE — Telephone Encounter (Signed)
per pof to sch pt appt-gave pt copy of avs-adv [facility] will call to sch PET

## 2015-05-10 NOTE — Patient Instructions (Signed)
[facility] Discharge Instructions for Patients Receiving Chemotherapy  Today you received the following chemotherapy agents Adriamycin/Vincristin/Cytoxan/Rituxan To help prevent nausea and vomiting after your treatment, we encourage you to take your nausea medication as prescribed.If you develop nausea and vomiting that is not controlled by your nausea medication, call the clinic.   BELOW ARE SYMPTOMS THAT SHOULD BE REPORTED IMMEDIATELY:  *FEVER GREATER THAN 100.5 F  *CHILLS WITH OR WITHOUT FEVER  NAUSEA AND VOMITING THAT IS NOT CONTROLLED WITH YOUR NAUSEA MEDICATION  *UNUSUAL SHORTNESS OF BREATH  *UNUSUAL BRUISING OR BLEEDING  TENDERNESS IN MOUTH AND THROAT WITH OR WITHOUT PRESENCE OF ULCERS  *URINARY PROBLEMS  *BOWEL PROBLEMS  UNUSUAL RASH Items with * indicate a potential emergency and should be followed up as soon as possible.  Feel free to call the clinic you have any questions or concerns. The clinic phone number is [phone].  Please show the [facility] at check-in to the Emergency Department and triage nurse.

## 2015-05-10 NOTE — Progress Notes (Signed)
[facility] OFFICE PROGRESS NOTE  Patient Care Team: Elizabeth Barnes, MD as PCP - General (Family Medicine) John Byers, MD as Consulting Physician (Otolaryngology) Ni Gorsuch, MD as Consulting Physician (Hematology and Oncology)  SUMMARY OF ONCOLOGIC HISTORY: Oncology History   Follicular lymphoma grade 3a   Staging form: Lymphoid Neoplasms, AJCC 6th Edition     Clinical stage from 12/27/2014: Stage III - Signed by Ni Gorsuch, MD on 12/27/2014        Follicular lymphoma grade 3a ([facility])   10/31/2014 Pathology Results Accession: [id number] FNA showed atypical lymphoid population   11/28/2014 Pathology Results Accession: [id number] Biopsy showed high grade follicular lymphoma   11/28/2014 Surgery He had open biopsy of left neck mass   12/22/2014 Imaging [facility] showed normal EF   12/22/2014 Imaging PET scan showed stage III disease   12/22/2014 Bone Marrow Biopsy Bone marrow biopsy was negative   01/05/2015 -  Chemotherapy He received R- CHOP chemotherapy   02/14/2015 Imaging PEt CT scan showed near complete response to Rx   04/19/2015 Adverse Reaction  he has peripheral neuropathy. Dose of vincristine is reduced    INTERVAL HISTORY: Please see below for problem oriented charting. He is seen prior to cycle 6 of treatment. Recently, I prescribed Augmentin for dental infection. It had caused significant nausea and he stopped taking it after several doses. He continues to have occasional toothache but no symptoms to suggest dental abscess. He continues have peripheral neuropathy but it does not cause pain. He denies mucositis.  REVIEW OF SYSTEMS:   Constitutional: Denies fevers, chills or abnormal weight loss Eyes: Denies blurriness of vision Ears, nose, mouth, throat, and face: Denies mucositis or sore throat Respiratory: Denies cough, dyspnea or wheezes Cardiovascular: Denies palpitation, chest discomfort or lower extremity swelling Gastrointestinal:  Denies nausea, heartburn  or change in bowel habits Skin: Denies abnormal skin rashes Lymphatics: Denies new lymphadenopathy or easy bruising Neurological:Denies numbness, tingling or new weaknesses Behavioral/Psych: Mood is stable, no new changes  All other systems were reviewed with the patient and are negative.  I have reviewed the past medical history, past surgical history, social history and family history with the patient and they are unchanged from previous note.  ALLERGIES:  is allergic to allopurinol.  MEDICATIONS:  Current Outpatient Prescriptions  Medication Sig Dispense Refill  . amoxicillin-clavulanate (AUGMENTIN) 875-125 MG tablet Take 1 tablet by mouth 2 (two) times daily. 14 tablet 0  . lidocaine-prilocaine (EMLA) cream Apply to affected area once 30 g 3  . ondansetron (ZOFRAN) 8 MG tablet Take 1 tablet (8 mg total) by mouth every 8 (eight) hours as needed. 30 tablet 1  . predniSONE (DELTASONE) 20 MG tablet Take 3 tablets (60 mg total) by mouth daily with breakfast. Take on days 2-5 of chemotherapy. 60 tablet 0  . prochlorperazine (COMPAZINE) 10 MG tablet Take 1 tablet (10 mg total) by mouth every 6 (six) hours as needed (Nausea or vomiting). 30 tablet 6   No current facility-administered medications for this visit.   Facility-Administered Medications Ordered in Other Visits  Medication Dose Route Frequency Provider Last Rate Last Dose  . sodium chloride 0.9 % injection 10 mL  10 mL Intravenous PRN Ni Gorsuch, MD   10 mL at 01/26/15 1346  . sodium chloride 0.9 % injection 10 mL  10 mL Intravenous PRN Ni Gorsuch, MD        PHYSICAL EXAMINATION: ECOG PERFORMANCE STATUS: 1 - Symptomatic but completely ambulatory  Filed Vitals:   05/10/15   1025  BP: 120/73  Pulse: 71  Temp: 97.5 F (36.4 C)  Resp: 18   Filed Weights   05/10/15 1025  Weight: 138 lb 8 oz (62.823 kg)    GENERAL:alert, no distress and comfortable SKIN: skin color, texture, turgor are normal, no rashes or significant  lesions EYES: normal, Conjunctiva are pink and non-injected, sclera clear OROPHARYNX:no exudate, no erythema and lips, buccal mucosa, and tongue normal  NECK: supple, thyroid normal size, non-tender, without nodularity LYMPH:  no palpable lymphadenopathy in the cervical, axillary or inguinal LUNGS: clear to auscultation and percussion with normal breathing effort HEART: regular rate & rhythm and no murmurs and no lower extremity edema ABDOMEN:abdomen soft, non-tender and normal bowel sounds Musculoskeletal:no cyanosis of digits and no clubbing  NEURO: alert & oriented x 3 with fluent speech, no focal motor/sensory deficits  LABORATORY DATA:  I have reviewed the data as listed    Component Value Date/Time   NA 142 05/10/2015 1010   NA 138 06/13/2010 1000   K 3.9 05/10/2015 1010   K 4.0 06/13/2010 1000   CL 100 06/13/2010 1000   CO2 30* 05/10/2015 1010   CO2 30 06/13/2010 1000   GLUCOSE 87 05/10/2015 1010   GLUCOSE 126* 06/13/2010 1000   BUN 13.9 05/10/2015 1010   BUN 4* 06/13/2010 1000   CREATININE 0.8 05/10/2015 1010   CREATININE 0.75 06/13/2010 1000   CALCIUM 9.7 05/10/2015 1010   CALCIUM 8.4 06/13/2010 1000   PROT 6.4 05/10/2015 1010   PROT 5.9* 06/13/2010 1000   ALBUMIN 4.2 05/10/2015 1010   ALBUMIN 2.8* 06/13/2010 1000   AST 17 05/10/2015 1010   AST 109* 06/13/2010 1000   ALT 13 05/10/2015 1010   ALT 217* 06/13/2010 1000   ALKPHOS 78 05/10/2015 1010   ALKPHOS 211* 06/13/2010 1000   BILITOT 0.62 05/10/2015 1010   BILITOT 0.8 06/13/2010 1000   GFRNONAA >60 06/13/2010 1000   GFRAA  06/13/2010 1000    >60        The eGFR has been calculated using the MDRD equation. This calculation has not been validated in all clinical situations. eGFR's persistently <60 mL/min signify possible Chronic Kidney Disease.    No results found for: SPEP, UPEP  Lab Results  Component Value Date   WBC 3.4* 05/10/2015   NEUTROABS 2.0 05/10/2015   HGB 12.8* 05/10/2015   HCT 38.7  05/10/2015   MCV 92.5 05/10/2015   PLT 298 05/10/2015      Chemistry      Component Value Date/Time   NA 142 05/10/2015 1010   NA 138 06/13/2010 1000   K 3.9 05/10/2015 1010   K 4.0 06/13/2010 1000   CL 100 06/13/2010 1000   CO2 30* 05/10/2015 1010   CO2 30 06/13/2010 1000   BUN 13.9 05/10/2015 1010   BUN 4* 06/13/2010 1000   CREATININE 0.8 05/10/2015 1010   CREATININE 0.75 06/13/2010 1000      Component Value Date/Time   CALCIUM 9.7 05/10/2015 1010   CALCIUM 8.4 06/13/2010 1000   ALKPHOS 78 05/10/2015 1010   ALKPHOS 211* 06/13/2010 1000   AST 17 05/10/2015 1010   AST 109* 06/13/2010 1000   ALT 13 05/10/2015 1010   ALT 217* 06/13/2010 1000   BILITOT 0.62 05/10/2015 1010   BILITOT 0.8 06/13/2010 1000      ASSESSMENT & PLAN:  Follicular lymphoma grade 3a  He tolerated treatment well up from new peripheral neuropathy. I will reduce the dose of vincristine by  50%. I will proceed with treatment today. I plan on repeat PET scan next month before removal of port.   Pancytopenia due to antineoplastic chemotherapy ([facility]) This is likely due to recent treatment. The patient denies recent history of bleeding such as epistaxis, hematuria or hematochezia. He is asymptomatic from the anemia. I will observe for now.  He does not require transfusion now. I will continue the chemotherapy at current dose without dosage adjustment.  If the anemia gets progressive worse in the future, I might have to delay his treatment or adjust the chemotherapy dose.   Neuropathy due to chemotherapeutic drug ([facility])  He had mild grade 1 neuropathy. I will reduce the dose of vincristine for now.  Chronic dental infection  The patient complaining of tooth pain. I do not feel that it is necessary to hold treatment. I prescribed a course of antibiotics but they cause nausea. The patient has elected to hold off further treatment until his chemotherapy is completed.   Orders Placed This Encounter   Procedures  . NM PET Image Restag (PS) Skull Base To Thigh    Standing Status: Future     Number of Occurrences:      Standing Expiration Date: 07/09/2016    Order Specific Question:  Reason for Exam (SYMPTOM  OR DIAGNOSIS REQUIRED)    Answer:  staging lymphoma assess response to Rx    Order Specific Question:  Preferred imaging location?    Answer:  [facility]   All questions were answered. The patient knows to call the clinic with any problems, questions or concerns. No barriers to learning was detected. I spent 25 minutes counseling the patient face to face. The total time spent in the appointment was 40 minutes and more than 50% was on counseling and review of test results     GORSUCH, NI, MD 05/10/2015 10:56 AM    

## 2015-05-18 ENCOUNTER — Other Ambulatory Visit: Payer: Self-pay | Admitting: Hematology and Oncology

## 2015-06-06 ENCOUNTER — Encounter (HOSPITAL_COMMUNITY)
Admission: RE | Admit: 2015-06-06 | Discharge: 2015-06-06 | Disposition: A | Discharge status: Home / Self Care | Payer: 59 | Source: Ambulatory Visit | Attending: Hematology and Oncology | Admitting: Hematology and Oncology

## 2015-06-06 DIAGNOSIS — C8238 Follicular lymphoma grade IIIa, lymph nodes of multiple sites: Secondary | ICD-10-CM | POA: Diagnosis present

## 2015-06-06 LAB — GLUCOSE, CAPILLARY: Glucose-Capillary: 77 mg/dL (ref 65–99)

## 2015-06-06 MED ORDER — FLUDEOXYGLUCOSE F - 18 (FDG) INJECTION: 7.9000 | Freq: Once | INTRAVENOUS | Status: DC | PRN

## 2015-06-07 ENCOUNTER — Encounter: Payer: Self-pay | Admitting: Hematology and Oncology

## 2015-06-07 ENCOUNTER — Other Ambulatory Visit: Payer: Self-pay | Admitting: Hematology and Oncology

## 2015-06-07 DIAGNOSIS — C8238 Follicular lymphoma grade IIIa, lymph nodes of multiple sites: Secondary | ICD-10-CM

## 2015-06-08 ENCOUNTER — Telehealth: Payer: Self-pay | Admitting: Hematology and Oncology

## 2015-06-08 ENCOUNTER — Other Ambulatory Visit (HOSPITAL_BASED_OUTPATIENT_CLINIC_OR_DEPARTMENT_OTHER): Payer: 59

## 2015-06-08 ENCOUNTER — Encounter: Payer: Self-pay | Admitting: Hematology and Oncology

## 2015-06-08 ENCOUNTER — Ambulatory Visit (HOSPITAL_BASED_OUTPATIENT_CLINIC_OR_DEPARTMENT_OTHER): Payer: 59 | Admitting: Hematology and Oncology

## 2015-06-08 VITALS — BP 124/76 | HR 72 | Temp 97.8°F | Resp 18 | Ht 67.0 in | Wt 140.4 lb

## 2015-06-08 DIAGNOSIS — G620 Drug-induced polyneuropathy: Secondary | ICD-10-CM

## 2015-06-08 DIAGNOSIS — D61810 Antineoplastic chemotherapy induced pancytopenia: Secondary | ICD-10-CM

## 2015-06-08 DIAGNOSIS — C8238 Follicular lymphoma grade IIIa, lymph nodes of multiple sites: Secondary | ICD-10-CM

## 2015-06-08 DIAGNOSIS — M542 Cervicalgia: Secondary | ICD-10-CM | POA: Diagnosis not present

## 2015-06-08 DIAGNOSIS — T451X5A Adverse effect of antineoplastic and immunosuppressive drugs, initial encounter: Secondary | ICD-10-CM

## 2015-06-08 LAB — CBC & DIFF AND RETIC
BASO%: 1 % (ref 0.0–2.0)
BASOS ABS: 0 10*3/uL (ref 0.0–0.1)
EOS ABS: 0.1 10*3/uL (ref 0.0–0.5)
EOS%: 3.4 % (ref 0.0–7.0)
HEMATOCRIT: 38.4 % (ref 38.4–49.9)
HEMOGLOBIN: 12.9 g/dL — AB (ref 13.0–17.1)
IMMATURE RETIC FRACT: 0.5 % — AB (ref 3.00–10.60)
LYMPH#: 1.2 10*3/uL (ref 0.9–3.3)
LYMPH%: 30.1 % (ref 14.0–49.0)
MCH: 31 pg (ref 27.2–33.4)
MCHC: 33.6 g/dL (ref 32.0–36.0)
MCV: 92.3 fL (ref 79.3–98.0)
MONO#: 0.7 10*3/uL (ref 0.1–0.9)
MONO%: 19 % — ABNORMAL HIGH (ref 0.0–14.0)
NEUT#: 1.8 10*3/uL (ref 1.5–6.5)
NEUT%: 46.5 % (ref 39.0–75.0)
PLATELETS: 240 10*3/uL (ref 140–400)
RBC: 4.16 10*6/uL — ABNORMAL LOW (ref 4.20–5.82)
RDW: 14.2 % (ref 11.0–14.6)
RETIC CT ABS: 62.82 10*3/uL (ref 34.80–93.90)
Retic %: 1.51 % (ref 0.80–1.80)
WBC: 3.9 10*3/uL — ABNORMAL LOW (ref 4.0–10.3)

## 2015-06-08 LAB — COMPREHENSIVE METABOLIC PANEL
ALBUMIN: 4.2 g/dL (ref 3.5–5.0)
ALK PHOS: 83 U/L (ref 40–150)
ALT: 15 U/L (ref 0–55)
ANION GAP: 10 meq/L (ref 3–11)
AST: 19 U/L (ref 5–34)
BILIRUBIN TOTAL: 0.37 mg/dL (ref 0.20–1.20)
BUN: 14.2 mg/dL (ref 7.0–26.0)
CALCIUM: 9.5 mg/dL (ref 8.4–10.4)
CO2: 27 meq/L (ref 22–29)
CREATININE: 0.8 mg/dL (ref 0.7–1.3)
Chloride: 107 mEq/L (ref 98–109)
Glucose: 87 mg/dl (ref 70–140)
Potassium: 4.8 mEq/L (ref 3.5–5.1)
Sodium: 144 mEq/L (ref 136–145)
TOTAL PROTEIN: 6.5 g/dL (ref 6.4–8.3)

## 2015-06-08 NOTE — Progress Notes (Signed)
[place] OFFICE PROGRESS NOTE  Patient Care Team: Leighton Ruff, MD as PCP - General (Family Medicine) Melissa Montane, MD as Consulting Physician (Otolaryngology) Heath Lark, MD as Consulting Physician (Hematology and Oncology)  SUMMARY OF ONCOLOGIC HISTORY: Oncology History   Follicular lymphoma grade 3a   Staging form: Lymphoid Neoplasms, AJCC 6th Edition     Clinical stage from 12/27/2014: Stage III - Signed by Heath Lark, MD on 12/27/2014        Grade 3a follicular lymphoma of lymph nodes of multiple regions [facility])   10/31/2014 Pathology Results Accession: [id number] showed atypical lymphoid population   11/28/2014 Pathology Results Accession: [id number] Biopsy showed high grade follicular lymphoma   1/96/2229 Surgery He had open biopsy of left neck mass   12/22/2014 Imaging [facility] showed normal EF   12/22/2014 Imaging PET scan showed stage III disease   12/22/2014 Bone Marrow Biopsy Bone marrow biopsy was negative   01/05/2015 -  Chemotherapy He received R- [facility] chemotherapy   02/14/2015 Imaging PEt CT scan showed near complete response to Rx   04/19/2015 Adverse Reaction  he has peripheral neuropathy. Dose of vincristine is reduced   06/06/2015 Imaging PET CT showed persistent lymphadenopathy but not hypermetabolic    INTERVAL HISTORY: Please see below for problem oriented charting. He returns for further follow-up. He had persistent left neck pain and mild peripheral neuropathy. Complain of fatigue. He had one episode of vomiting, resolved.  REVIEW OF SYSTEMS:   Constitutional: Denies fevers, chills or abnormal weight loss Eyes: Denies blurriness of vision Ears, nose, mouth, throat, and face: Denies mucositis or sore throat Respiratory: Denies cough, dyspnea or wheezes Cardiovascular: Denies palpitation, chest discomfort or lower extremity swelling Gastrointestinal:  Denies nausea, heartburn or change in bowel habits Skin: Denies abnormal skin  rashes Lymphatics: Denies new lymphadenopathy or easy bruising Neurological:Denies numbness, tingling or new weaknesses Behavioral/Psych: Mood is stable, no new changes  All other systems were reviewed with the patient and are negative.  I have reviewed the past medical history, past surgical history, social history and family history with the patient and they are unchanged from previous note.  ALLERGIES:  is allergic to allopurinol.  MEDICATIONS:  No current outpatient prescriptions on file.   No current facility-administered medications for this visit.   Facility-Administered Medications Ordered in Other Visits  Medication Dose Route Frequency Provider Last Rate Last Dose  . fludeoxyglucose F - 18 (FDG) injection 7.9 milli Curie  7.9 milli Curie Intravenous Once PRN Heath Lark, MD      . sodium chloride 0.9 % injection 10 mL  10 mL Intravenous PRN Heath Lark, MD   10 mL at 01/26/15 1346  . sodium chloride 0.9 % injection 10 mL  10 mL Intravenous PRN Heath Lark, MD        PHYSICAL EXAMINATION: ECOG PERFORMANCE STATUS: 0 - Asymptomatic  Filed Vitals:   06/08/15 0815  BP: 124/76  Pulse: 72  Temp: 97.8 F (36.6 C)  Resp: 18   Filed Weights   06/08/15 0815  Weight: 140 lb 6.4 oz (63.685 kg)    GENERAL:alert, no distress and comfortable SKIN: skin color, texture, turgor are normal, no rashes or significant lesions EYES: normal, Conjunctiva are pink and non-injected, sclera clear Musculoskeletal:no cyanosis of digits and no clubbing  NEURO: alert & oriented x 3 with fluent speech, no focal motor/sensory deficits  LABORATORY DATA:  I have reviewed the data as listed    Component Value Date/Time   NA 144  06/08/2015 0803   NA 138 06/13/2010 1000   K 4.8 06/08/2015 0803   K 4.0 06/13/2010 1000   CL 100 06/13/2010 1000   CO2 27 06/08/2015 0803   CO2 30 06/13/2010 1000   GLUCOSE 87 06/08/2015 0803   GLUCOSE 126* 06/13/2010 1000   BUN 14.2 06/08/2015 0803   BUN 4*  06/13/2010 1000   CREATININE 0.8 06/08/2015 0803   CREATININE 0.75 06/13/2010 1000   CALCIUM 9.5 06/08/2015 0803   CALCIUM 8.4 06/13/2010 1000   PROT 6.5 06/08/2015 0803   PROT 5.9* 06/13/2010 1000   ALBUMIN 4.2 06/08/2015 0803   ALBUMIN 2.8* 06/13/2010 1000   AST 19 06/08/2015 0803   AST 109* 06/13/2010 1000   ALT 15 06/08/2015 0803   ALT 217* 06/13/2010 1000   ALKPHOS 83 06/08/2015 0803   ALKPHOS 211* 06/13/2010 1000   BILITOT 0.37 06/08/2015 0803   BILITOT 0.8 06/13/2010 1000   GFRNONAA >60 06/13/2010 1000   GFRAA  06/13/2010 1000    >60        The eGFR has been calculated using the MDRD equation. This calculation has not been validated in all clinical situations. eGFR's persistently <60 mL/min signify possible Chronic Kidney Disease.    No results found for: SPEP, UPEP  Lab Results  Component Value Date   WBC 3.9* 06/08/2015   NEUTROABS 1.8 06/08/2015   HGB 12.9* 06/08/2015   HCT 38.4 06/08/2015   MCV 92.3 06/08/2015   PLT 240 06/08/2015      Chemistry      Component Value Date/Time   NA 144 06/08/2015 0803   NA 138 06/13/2010 1000   K 4.8 06/08/2015 0803   K 4.0 06/13/2010 1000   CL 100 06/13/2010 1000   CO2 27 06/08/2015 0803   CO2 30 06/13/2010 1000   BUN 14.2 06/08/2015 0803   BUN 4* 06/13/2010 1000   CREATININE 0.8 06/08/2015 0803   CREATININE 0.75 06/13/2010 1000      Component Value Date/Time   CALCIUM 9.5 06/08/2015 0803   CALCIUM 8.4 06/13/2010 1000   ALKPHOS 83 06/08/2015 0803   ALKPHOS 211* 06/13/2010 1000   AST 19 06/08/2015 0803   AST 109* 06/13/2010 1000   ALT 15 06/08/2015 0803   ALT 217* 06/13/2010 1000   BILITOT 0.37 06/08/2015 0803   BILITOT 0.8 06/13/2010 1000       RADIOGRAPHIC STUDIES: I reviewed the imaging study with him I have personally reviewed the radiological images as listed and agreed with the findings in the report.    ASSESSMENT & PLAN:  Grade 3a follicular lymphoma of lymph nodes of multiple regions  [facility])  I reviewed the imaging study. We discussed the role of continuing rituximab every other month for 2 years. The patient is comfortable to discontinue treatment. I will get the port removed. I will see him every 3 months with history, physical examination and blood work  Pancytopenia due to antineoplastic chemotherapy [facility]) This is likely due to recent treatment. The patient denies recent history of bleeding such as epistaxis, hematuria or hematochezia. He is asymptomatic from the anemia. I will observe for now.   Neck pain on left side  He has persistent left neck pain. I suspect it could be neuropathic pain. Recommend observation only   Neuropathy due to chemotherapeutic drug [facility])  He had mild grade 1 neuropathy. Observe only for now   Orders Placed This Encounter  Procedures  . IR Removal Tun Access W/ Port W/O [employer] [facility] [employer]  Standing Status: Future     Number of Occurrences:      Standing Expiration Date: 08/08/2016    Order Specific Question:  Reason for exam:    Answer:  remove port    Order Specific Question:  Preferred Imaging Location?    Answer:  [facility]  . CBC with Differential/Platelet    Standing Status: Future     Number of Occurrences:      Standing Expiration Date: 07/12/2016  . Comprehensive metabolic panel    Standing Status: Future     Number of Occurrences:      Standing Expiration Date: 07/12/2016  . Lactate dehydrogenase    Standing Status: Standing     Number of Occurrences: 1     Standing Expiration Date:   . Lactate dehydrogenase (LDH) - [facility]    Standing Status: Future     Number of Occurrences:      Standing Expiration Date: 07/12/2016   All questions were answered. The patient knows to call the clinic with any problems, questions or concerns. No barriers to learning was detected. I spent 20 minutes counseling the patient face to face. The total time spent in the appointment was 25 minutes and more than 50% was on counseling and review  of test results     [facility], [place], MD 06/08/2015 11:08 AM

## 2015-06-08 NOTE — Assessment & Plan Note (Signed)
This is likely due to recent treatment. The patient denies recent history of bleeding such as epistaxis, hematuria or hematochezia. He is asymptomatic from the anemia. I will observe for now.    

## 2015-06-08 NOTE — Assessment & Plan Note (Addendum)
He has persistent left neck pain. I suspect it could be neuropathic pain. Recommend observation only

## 2015-06-08 NOTE — Telephone Encounter (Signed)
Gave and printed appt sched and avs for pt for March. °

## 2015-06-08 NOTE — Assessment & Plan Note (Signed)
He had mild grade 1 neuropathy. Observe only for now

## 2015-06-08 NOTE — Assessment & Plan Note (Signed)
I reviewed the imaging study. We discussed the role of continuing rituximab every other month for 2 years. The patient is comfortable to discontinue treatment. I will get the port removed. I will see him every 3 months with history, physical examination and blood work

## 2015-06-13 ENCOUNTER — Other Ambulatory Visit: Payer: Self-pay | Admitting: Radiology

## 2015-06-15 ENCOUNTER — Encounter (HOSPITAL_COMMUNITY): Payer: Self-pay

## 2015-06-15 ENCOUNTER — Ambulatory Visit (HOSPITAL_COMMUNITY)
Admission: RE | Admit: 2015-06-15 | Discharge: 2015-06-15 | Disposition: A | Discharge status: Home / Self Care | Payer: 59 | Source: Ambulatory Visit | Attending: Hematology and Oncology | Admitting: Hematology and Oncology

## 2015-06-15 DIAGNOSIS — C8290 Follicular lymphoma, unspecified, unspecified site: Secondary | ICD-10-CM | POA: Insufficient documentation

## 2015-06-15 DIAGNOSIS — Z452 Encounter for adjustment and management of vascular access device: Secondary | ICD-10-CM | POA: Diagnosis not present

## 2015-06-15 DIAGNOSIS — C8238 Follicular lymphoma grade IIIa, lymph nodes of multiple sites: Secondary | ICD-10-CM

## 2015-06-15 LAB — PROTIME-INR
INR: 1.03 (ref 0.00–1.49)
Prothrombin Time: 13.7 seconds (ref 11.6–15.2)

## 2015-06-15 LAB — BASIC METABOLIC PANEL
ANION GAP: 9 (ref 5–15)
BUN: 15 mg/dL (ref 6–20)
CHLORIDE: 106 mmol/L (ref 101–111)
CO2: 26 mmol/L (ref 22–32)
Calcium: 9 mg/dL (ref 8.9–10.3)
Creatinine, Ser: 0.7 mg/dL (ref 0.61–1.24)
GFR calc non Af Amer: >60 mL/min (ref >60)
Glucose, Bld: 84 mg/dL (ref 65–99)
POTASSIUM: 4 mmol/L (ref 3.5–5.1)
Sodium: 141 mmol/L (ref 135–145)

## 2015-06-15 LAB — CBC
HEMATOCRIT: 37.6 % — AB (ref 39.0–52.0)
HEMOGLOBIN: 12.5 g/dL — AB (ref 13.0–17.0)
MCH: 30.6 pg (ref 26.0–34.0)
MCHC: 33.2 g/dL (ref 30.0–36.0)
MCV: 92.2 fL (ref 78.0–100.0)
Platelets: 225 10*3/uL (ref 150–400)
RBC: 4.08 MIL/uL — AB (ref 4.22–5.81)
RDW: 13.9 % (ref 11.5–15.5)
WBC: 3.8 10*3/uL — ABNORMAL LOW (ref 4.0–10.5)

## 2015-06-15 LAB — APTT: aPTT: 29 seconds (ref 24–37)

## 2015-06-15 MED ORDER — SODIUM CHLORIDE 0.9 % IV SOLN
Freq: Once | INTRAVENOUS | Status: AC
  Administered 2015-06-15: 500 mL via INTRAVENOUS

## 2015-06-15 MED ORDER — MIDAZOLAM HCL 2 MG/2ML IJ SOLN
INTRAMUSCULAR | Status: AC
  Filled 2015-06-15: qty 6

## 2015-06-15 MED ORDER — CEFAZOLIN SODIUM-DEXTROSE 2-3 GM-% IV SOLR
INTRAVENOUS | Status: AC
  Filled 2015-06-15: qty 50

## 2015-06-15 MED ORDER — FENTANYL CITRATE (PF) 100 MCG/2ML IJ SOLN
INTRAMUSCULAR | Status: AC
  Filled 2015-06-15: qty 4

## 2015-06-15 MED ORDER — LIDOCAINE-EPINEPHRINE 2 %-1:100000 IJ SOLN
INTRAMUSCULAR | Status: AC
  Filled 2015-06-15: qty 1

## 2015-06-15 MED ORDER — CEFAZOLIN SODIUM-DEXTROSE 2-3 GM-% IV SOLR
2.0000 g | INTRAVENOUS | Status: AC
  Administered 2015-06-15: 2 g via INTRAVENOUS

## 2015-06-15 NOTE — H&P (Signed)
   HPI: Patient with follicular lymphoma, no longer undergoing treatment. He has a right IJ port placed 12/2014 by IR- no interval problems. He has been seen by Dr. Alvy Bimler on 06/08/15 and request for port removal.  The patient has had a H&P performed within the last 30 days, all history, medications, and exam have been reviewed. The patient denies any interval changes since the H&P.  Medications: Prior to Admission medications   Medication Sig Start Date End Date Taking? Authorizing Provider  Probiotic Product (PROBIOTIC & ACIDOPHILUS EX ST PO) Take 1 capsule by mouth daily.   Yes Historical Provider, MD     Vital Signs: BP 132/82 mmHg  Pulse 72  Temp(Src) 97.3 F (36.3 C) (Oral)  Resp 16  Ht 5\' 7"  (1.702 m)  Wt 140 lb 6 oz (63.674 kg)  BMI 21.98 kg/m2  SpO2 100%  Physical Exam  Constitutional: He is oriented to person, place, and time. No distress.  HENT:  Head: Normocephalic and atraumatic.  Neck: No tracheal deviation present.  Cardiovascular: Normal rate and regular rhythm.  Exam reveals no gallop and no friction rub.   No murmur heard. Pulmonary/Chest: Effort normal. No respiratory distress. He has no wheezes. He has no rales.  Abdominal: Soft. Bowel sounds are normal. He exhibits no distension. There is no tenderness.  Neurological: He is alert and oriented to person, place, and time.  Skin: Skin is warm and dry. He is not diaphoretic.  Right anterior chest wall port intact, well healed scar    Mallampati Score:  MD Evaluation Airway: WNL Heart: WNL Abdomen: WNL Chest/ Lungs: WNL ASA  Classification: 2 Mallampati/Airway Score: One  Labs:  CBC:  Recent Labs  04/19/15 0943 05/10/15 1009 06/08/15 0803 06/15/15 0855  WBC 3.8* 3.4* 3.9* 3.8*  HGB 12.8* 12.8* 12.9* 12.5*  HCT 38.3* 38.7 38.4 37.6*  PLT 343 298 240 225    COAGS:  Recent Labs  01/04/15 0845 06/15/15 0855  INR 1.09 1.03  APTT 32 29    BMP:  Recent Labs  04/19/15 0943  05/10/15 1010 06/08/15 0803 06/15/15 0855  NA 141 142 144 141  K 3.9 3.9 4.8 4.0  CL  --   --   --  106  CO2 30* 30* 27 26  GLUCOSE 96 87 87 84  BUN 10.6 13.9 14.2 15  CALCIUM 9.5 9.7 9.5 9.0  CREATININE 0.8 0.8 0.8 0.70  GFRNONAA  --   --   --  >60  GFRAA  --   --   --  >60    LIVER FUNCTION TESTS:  Recent Labs  03/30/15 0843 04/19/15 0943 05/10/15 1010 06/08/15 0803  BILITOT 0.74 0.48 0.62 0.37  AST 13 14 17 19   ALT 10 13 13 15   ALKPHOS 73 80 78 83  PROT 6.2* 6.5 6.4 6.5  ALBUMIN 4.1 4.3 4.2 4.2    Assessment/Plan:  Follicular lymphoma, no longer undergoing treatment Right IJ port placed 12/2014 by IR- no interval problems Seen by Dr. Alvy Bimler 06/08/15 Request for port removal The patient has been NPO, no blood thinners taken, labs and vitals have been reviewed. Risks and Benefits discussed with the patient including, but not limited to bleeding, infection or pneumothorax. All of the patient's questions were answered, patient is agreeable to proceed. Consent signed and in chart.   SignedHedy Jacob 06/15/2015, 9:29 AM

## 2015-06-15 NOTE — Procedures (Signed)
Interventional Radiology Procedure Note  Procedure: bedside removal of right IJ port catheter.  Complications: None Recommendations:  - Ok to shower tomorrow - Do not submerge for 7 days - Routine wound care   Signed,  Dulcy Fanny. Earleen Newport, DO

## 2015-06-15 NOTE — Discharge Instructions (Signed)
May remove bandage tomorrow and shower.   Call MD if any problems or questions. Call for redness, swelling, drainage, fever or increased pain.

## 2015-08-30 ENCOUNTER — Encounter: Payer: Self-pay | Admitting: Hematology and Oncology

## 2015-09-06 ENCOUNTER — Telehealth: Payer: Self-pay | Admitting: Hematology and Oncology

## 2015-09-06 ENCOUNTER — Encounter: Payer: Self-pay | Admitting: Hematology and Oncology

## 2015-09-06 ENCOUNTER — Other Ambulatory Visit (HOSPITAL_BASED_OUTPATIENT_CLINIC_OR_DEPARTMENT_OTHER): Payer: BLUE CROSS/BLUE SHIELD

## 2015-09-06 ENCOUNTER — Ambulatory Visit (HOSPITAL_BASED_OUTPATIENT_CLINIC_OR_DEPARTMENT_OTHER): Payer: BLUE CROSS/BLUE SHIELD | Admitting: Hematology and Oncology

## 2015-09-06 VITALS — BP 126/81 | HR 62 | Temp 97.5°F | Resp 18 | Ht 67.0 in | Wt 146.3 lb

## 2015-09-06 DIAGNOSIS — C8238 Follicular lymphoma grade IIIa, lymph nodes of multiple sites: Secondary | ICD-10-CM

## 2015-09-06 DIAGNOSIS — D702 Other drug-induced agranulocytosis: Secondary | ICD-10-CM | POA: Diagnosis not present

## 2015-09-06 DIAGNOSIS — T451X5A Adverse effect of antineoplastic and immunosuppressive drugs, initial encounter: Secondary | ICD-10-CM

## 2015-09-06 DIAGNOSIS — G620 Drug-induced polyneuropathy: Secondary | ICD-10-CM | POA: Diagnosis not present

## 2015-09-06 LAB — COMPREHENSIVE METABOLIC PANEL
ALBUMIN: 4.3 g/dL (ref 3.5–5.0)
ALK PHOS: 91 U/L (ref 40–150)
AST: 15 U/L (ref 5–34)
Anion Gap: 8 mEq/L (ref 3–11)
BILIRUBIN TOTAL: 0.5 mg/dL (ref 0.20–1.20)
BUN: 14.2 mg/dL (ref 7.0–26.0)
CALCIUM: 9.5 mg/dL (ref 8.4–10.4)
CO2: 28 mEq/L (ref 22–29)
CREATININE: 0.9 mg/dL (ref 0.7–1.3)
Chloride: 106 mEq/L (ref 98–109)
EGFR: >90 mL/min/{1.73_m2} (ref >90)
GLUCOSE: 84 mg/dL (ref 70–140)
Potassium: 4.7 mEq/L (ref 3.5–5.1)
SODIUM: 142 meq/L (ref 136–145)
TOTAL PROTEIN: 6.9 g/dL (ref 6.4–8.3)

## 2015-09-06 LAB — CBC WITH DIFFERENTIAL/PLATELET
BASO%: 0.3 % (ref 0.0–2.0)
BASOS ABS: 0 10*3/uL (ref 0.0–0.1)
EOS%: 4.7 % (ref 0.0–7.0)
Eosinophils Absolute: 0.2 10*3/uL (ref 0.0–0.5)
HEMATOCRIT: 40.7 % (ref 38.4–49.9)
HEMOGLOBIN: 13.7 g/dL (ref 13.0–17.1)
LYMPH#: 1.3 10*3/uL (ref 0.9–3.3)
LYMPH%: 33.1 % (ref 14.0–49.0)
MCH: 29.8 pg (ref 27.2–33.4)
MCHC: 33.7 g/dL (ref 32.0–36.0)
MCV: 88.5 fL (ref 79.3–98.0)
MONO#: 0.3 10*3/uL (ref 0.1–0.9)
MONO%: 8.7 % (ref 0.0–14.0)
NEUT%: 53.2 % (ref 39.0–75.0)
NEUTROS ABS: 2 10*3/uL (ref 1.5–6.5)
Platelets: 202 10*3/uL (ref 140–400)
RBC: 4.6 10*6/uL (ref 4.20–5.82)
RDW: 12.9 % (ref 11.0–14.6)
WBC: 3.8 10*3/uL — AB (ref 4.0–10.3)

## 2015-09-06 LAB — LACTATE DEHYDROGENASE: LDH: 133 U/L (ref 125–245)

## 2015-09-06 NOTE — Assessment & Plan Note (Signed)
He had mild grade 1 neuropathy. Observe only for now It should improve in time

## 2015-09-06 NOTE — Assessment & Plan Note (Signed)
He has completed all treatment with no signs of disease recurrence. I do not recommend routine imaging study. I will see him back in 4 months for repeat history, physical examination and blood work.

## 2015-09-06 NOTE — Progress Notes (Signed)
[place] OFFICE PROGRESS NOTE  Patient Care Team: Leighton Ruff, MD as PCP - General (Family Medicine) Melissa Montane, MD as Consulting Physician (Otolaryngology) Heath Lark, MD as Consulting Physician (Hematology and Oncology)  SUMMARY OF ONCOLOGIC HISTORY: Oncology History   Follicular lymphoma grade 3a   Staging form: Lymphoid Neoplasms, AJCC 6th Edition     Clinical stage from 12/27/2014: Stage III - Signed by Heath Lark, MD on 12/27/2014        Grade 3a follicular lymphoma of lymph nodes of multiple regions [facility])   10/31/2014 Pathology Results Accession: [id number] showed atypical lymphoid population   11/28/2014 Pathology Results Accession: [id number] Biopsy showed high grade follicular lymphoma   07/22/4823 Surgery He had open biopsy of left neck mass   12/22/2014 Imaging [facility] showed normal EF   12/22/2014 Imaging PET scan showed stage III disease   12/22/2014 Bone Marrow Biopsy Bone marrow biopsy was negative   01/05/2015 -  Chemotherapy He received R- [facility]OP chemotherapy   02/14/2015 Imaging PEt CT scan showed near complete response to Rx   04/19/2015 Adverse Reaction  he has peripheral neuropathy. Dose of vincristine is reduced   06/06/2015 Imaging PET CT showed persistent lymphadenopathy but not hypermetabolic    INTERVAL HISTORY: Please see below for problem oriented charting. He denies recent infection. No new lymphadenopathy. He had recent neck discomfort, resolved spontaneously. He continues to complain of mild peripheral neuropathy affecting his fingers. He complained of mild discomfort over the port area and persistent dental discomfort, improving with better oral hygiene  REVIEW OF SYSTEMS:   Constitutional: Denies fevers, chills or abnormal weight loss Eyes: Denies blurriness of vision Ears, nose, mouth, throat, and face: Denies mucositis or sore throat Respiratory: Denies cough, dyspnea or wheezes Cardiovascular: Denies palpitation, chest discomfort  or lower extremity swelling Gastrointestinal:  Denies nausea, heartburn or change in bowel habits Skin: Denies abnormal skin rashes Lymphatics: Denies new lymphadenopathy or easy bruising Neurological:Denies numbness, tingling or new weaknesses Behavioral/Psych: Mood is stable, no new changes  All other systems were reviewed with the patient and are negative.  I have reviewed the past medical history, past surgical history, social history and family history with the patient and they are unchanged from previous note.  ALLERGIES:  is allergic to allopurinol.  MEDICATIONS:  Current Outpatient Prescriptions  Medication Sig Dispense Refill  . Probiotic Product (PROBIOTIC & ACIDOPHILUS EX ST PO) Take 1 capsule by mouth daily.     No current facility-administered medications for this visit.   Facility-Administered Medications Ordered in Other Visits  Medication Dose Route Frequency Provider Last Rate Last Dose  . sodium chloride 0.9 % injection 10 mL  10 mL Intravenous PRN Heath Lark, MD   10 mL at 01/26/15 1346  . sodium chloride 0.9 % injection 10 mL  10 mL Intravenous PRN Heath Lark, MD        PHYSICAL EXAMINATION: ECOG PERFORMANCE STATUS: 0 - Asymptomatic  Filed Vitals:   09/06/15 0827  BP: 126/81  Pulse: 62  Temp: 97.5 F (36.4 C)  Resp: 18   Filed Weights   09/06/15 0827  Weight: 146 lb 4.8 oz (66.361 kg)    GENERAL:alert, no distress and comfortable SKIN: skin color, texture, turgor are normal, no rashes or significant lesions EYES: normal, Conjunctiva are pink and non-injected, sclera clear OROPHARYNX:no exudate, no erythema and lips, buccal mucosa, and tongue normal  NECK: supple, thyroid normal size, non-tender, without nodularity LYMPH:  no palpable lymphadenopathy in the cervical, axillary  or inguinal LUNGS: clear to auscultation and percussion with normal breathing effort HEART: regular rate & rhythm and no murmurs and no lower extremity edema ABDOMEN:abdomen  soft, non-tender and normal bowel sounds Musculoskeletal:no cyanosis of digits and no clubbing  NEURO: alert & oriented x 3 with fluent speech, no focal motor/sensory deficits  LABORATORY DATA:  I have reviewed the data as listed    Component Value Date/Time   NA 141 06/15/2015 0855   NA 144 06/08/2015 0803   K 4.0 06/15/2015 0855   K 4.8 06/08/2015 0803   CL 106 06/15/2015 0855   CO2 26 06/15/2015 0855   CO2 27 06/08/2015 0803   GLUCOSE 84 06/15/2015 0855   GLUCOSE 87 06/08/2015 0803   BUN 15 06/15/2015 0855   BUN 14.2 06/08/2015 0803   CREATININE 0.70 06/15/2015 0855   CREATININE 0.8 06/08/2015 0803   CALCIUM 9.0 06/15/2015 0855   CALCIUM 9.5 06/08/2015 0803   PROT 6.5 06/08/2015 0803   PROT 5.9* 06/13/2010 1000   ALBUMIN 4.2 06/08/2015 0803   ALBUMIN 2.8* 06/13/2010 1000   AST 19 06/08/2015 0803   AST 109* 06/13/2010 1000   ALT 15 06/08/2015 0803   ALT 217* 06/13/2010 1000   ALKPHOS 83 06/08/2015 0803   ALKPHOS 211* 06/13/2010 1000   BILITOT 0.37 06/08/2015 0803   BILITOT 0.8 06/13/2010 1000   GFRNONAA >60 06/15/2015 0855   GFRAA >60 06/15/2015 0855    No results found for: SPEP, UPEP  Lab Results  Component Value Date   WBC 3.8* 09/06/2015   NEUTROABS 2.0 09/06/2015   HGB 13.7 09/06/2015   HCT 40.7 09/06/2015   MCV 88.5 09/06/2015   PLT 202 09/06/2015      Chemistry      Component Value Date/Time   NA 141 06/15/2015 0855   NA 144 06/08/2015 0803   K 4.0 06/15/2015 0855   K 4.8 06/08/2015 0803   CL 106 06/15/2015 0855   CO2 26 06/15/2015 0855   CO2 27 06/08/2015 0803   BUN 15 06/15/2015 0855   BUN 14.2 06/08/2015 0803   CREATININE 0.70 06/15/2015 0855   CREATININE 0.8 06/08/2015 0803      Component Value Date/Time   CALCIUM 9.0 06/15/2015 0855   CALCIUM 9.5 06/08/2015 0803   ALKPHOS 83 06/08/2015 0803   ALKPHOS 211* 06/13/2010 1000   AST 19 06/08/2015 0803   AST 109* 06/13/2010 1000   ALT 15 06/08/2015 0803   ALT 217* 06/13/2010 1000    BILITOT 0.37 06/08/2015 0803   BILITOT 0.8 06/13/2010 1000      ASSESSMENT & PLAN:  Grade 3a follicular lymphoma of lymph nodes of multiple regions (HCC) He has completed all treatment with no signs of disease recurrence. I do not recommend routine imaging study. I will see him back in 4 months for repeat history, physical examination and blood work.  Drug-induced leukopenia (HCC) This is likely due to recent treatment. The patient denies recent history of fevers, cough, chills, diarrhea or dysuria. He is asymptomatic from the leukopenia. I will observe for now.  Neuropathy due to chemotherapeutic drug (HCC)  He had mild grade 1 neuropathy. Observe only for now It should improve in time    Orders Placed This Encounter  Procedures  . CBC with Differential/Platelet    Standing Status: Future     Number of Occurrences:      Standing Expiration Date: 10/10/2016  . Comprehensive metabolic panel    Standing Status: Future       Number of Occurrences:      Standing Expiration Date: 10/10/2016  . Lactate dehydrogenase (LDH)    Standing Status: Future     Number of Occurrences:      Standing Expiration Date: 10/10/2016   All questions were answered. The patient knows to call the clinic with any problems, questions or concerns. No barriers to learning was detected. I spent 15 minutes counseling the patient face to face. The total time spent in the appointment was 20 minutes and more than 50% was on counseling and review of test results     GORSUCH, NI, MD 09/06/2015 9:00 AM    

## 2015-09-06 NOTE — Telephone Encounter (Signed)
per pof to sch pt appt-gave pt copy of avs °

## 2015-09-06 NOTE — Assessment & Plan Note (Signed)
This is likely due to recent treatment. The patient denies recent history of fevers, cough, chills, diarrhea or dysuria. He is asymptomatic from the leukopenia. I will observe for now.  

## 2015-12-21 ENCOUNTER — Encounter: Payer: Self-pay | Admitting: Hematology and Oncology

## 2016-01-07 ENCOUNTER — Telehealth: Payer: Self-pay | Admitting: Hematology and Oncology

## 2016-01-07 ENCOUNTER — Ambulatory Visit (HOSPITAL_BASED_OUTPATIENT_CLINIC_OR_DEPARTMENT_OTHER): Payer: BLUE CROSS/BLUE SHIELD | Admitting: Hematology and Oncology

## 2016-01-07 ENCOUNTER — Other Ambulatory Visit (HOSPITAL_BASED_OUTPATIENT_CLINIC_OR_DEPARTMENT_OTHER): Payer: BLUE CROSS/BLUE SHIELD

## 2016-01-07 ENCOUNTER — Encounter: Payer: Self-pay | Admitting: Hematology and Oncology

## 2016-01-07 VITALS — BP 144/80 | HR 60 | Temp 97.6°F | Resp 18 | Wt 143.1 lb

## 2016-01-07 DIAGNOSIS — C8238 Follicular lymphoma grade IIIa, lymph nodes of multiple sites: Secondary | ICD-10-CM

## 2016-01-07 DIAGNOSIS — Z8572 Personal history of non-Hodgkin lymphomas: Secondary | ICD-10-CM

## 2016-01-07 DIAGNOSIS — G620 Drug-induced polyneuropathy: Secondary | ICD-10-CM | POA: Diagnosis not present

## 2016-01-07 DIAGNOSIS — K0530 Chronic periodontitis, unspecified: Secondary | ICD-10-CM | POA: Diagnosis not present

## 2016-01-07 DIAGNOSIS — M545 Low back pain: Secondary | ICD-10-CM | POA: Diagnosis not present

## 2016-01-07 DIAGNOSIS — K047 Periapical abscess without sinus: Secondary | ICD-10-CM

## 2016-01-07 DIAGNOSIS — M7918 Myalgia, other site: Secondary | ICD-10-CM | POA: Insufficient documentation

## 2016-01-07 DIAGNOSIS — T451X5A Adverse effect of antineoplastic and immunosuppressive drugs, initial encounter: Secondary | ICD-10-CM

## 2016-01-07 DIAGNOSIS — R5382 Chronic fatigue, unspecified: Secondary | ICD-10-CM | POA: Insufficient documentation

## 2016-01-07 LAB — LACTATE DEHYDROGENASE: LDH: 125 U/L (ref 125–245)

## 2016-01-07 LAB — CBC WITH DIFFERENTIAL/PLATELET
BASO%: 0.7 % (ref 0.0–2.0)
BASOS ABS: 0 10*3/uL (ref 0.0–0.1)
EOS ABS: 0.2 10*3/uL (ref 0.0–0.5)
EOS%: 4.4 % (ref 0.0–7.0)
HCT: 41.2 % (ref 38.4–49.9)
HGB: 13.7 g/dL (ref 13.0–17.1)
LYMPH%: 28 % (ref 14.0–49.0)
MCH: 29.7 pg (ref 27.2–33.4)
MCHC: 33.2 g/dL (ref 32.0–36.0)
MCV: 89.5 fL (ref 79.3–98.0)
MONO#: 0.4 10*3/uL (ref 0.1–0.9)
MONO%: 8.9 % (ref 0.0–14.0)
NEUT#: 2.3 10*3/uL (ref 1.5–6.5)
NEUT%: 58 % (ref 39.0–75.0)
PLATELETS: 207 10*3/uL (ref 140–400)
RBC: 4.6 10*6/uL (ref 4.20–5.82)
RDW: 13.9 % (ref 11.0–14.6)
WBC: 4 10*3/uL (ref 4.0–10.3)
lymph#: 1.1 10*3/uL (ref 0.9–3.3)

## 2016-01-07 LAB — COMPREHENSIVE METABOLIC PANEL
ALT: 12 U/L (ref 0–55)
ANION GAP: 8 meq/L (ref 3–11)
AST: 15 U/L (ref 5–34)
Albumin: 4.3 g/dL (ref 3.5–5.0)
Alkaline Phosphatase: 84 U/L (ref 40–150)
BILIRUBIN TOTAL: 0.8 mg/dL (ref 0.20–1.20)
BUN: 10.3 mg/dL (ref 7.0–26.0)
CHLORIDE: 106 meq/L (ref 98–109)
CO2: 28 mEq/L (ref 22–29)
Calcium: 9.5 mg/dL (ref 8.4–10.4)
Creatinine: 0.8 mg/dL (ref 0.7–1.3)
Glucose: 88 mg/dl (ref 70–140)
POTASSIUM: 4.2 meq/L (ref 3.5–5.1)
Sodium: 142 mEq/L (ref 136–145)
TOTAL PROTEIN: 6.9 g/dL (ref 6.4–8.3)

## 2016-01-07 NOTE — Progress Notes (Signed)
[facility] OFFICE PROGRESS NOTE  Patient Care Team: Juluis Rainier, MD as PCP - General (Family Medicine) Suzanna Obey, MD as Consulting Physician (Otolaryngology) Artis Delay, MD as Consulting Physician (Hematology and Oncology)  SUMMARY OF ONCOLOGIC HISTORY: Oncology History   Follicular lymphoma grade 3a   Staging form: Lymphoid Neoplasms, AJCC 6th Edition     Clinical stage from 12/27/2014: Stage III - Signed by Artis Delay, MD on 12/27/2014        History of B-cell lymphoma   10/31/2014 Pathology Results Accession: [id number] showed atypical lymphoid population   11/28/2014 Pathology Results Accession: [id number] Biopsy showed high grade follicular lymphoma   11/28/2014 Surgery He had open biopsy of left neck mass   12/22/2014 Imaging [facility] showed normal EF   12/22/2014 Imaging PET scan showed stage III disease   12/22/2014 Bone Marrow Biopsy Bone marrow biopsy was negative   01/05/2015 - 05/10/2015 Chemotherapy He received R- CHOP chemotherapy x 6 cycles   02/14/2015 Imaging PEt CT scan showed near complete response to Rx   04/19/2015 Adverse Reaction  he has peripheral neuropathy. Dose of vincristine is reduced   06/06/2015 Imaging PET CT showed persistent lymphadenopathy but not hypermetabolic    INTERVAL HISTORY: Please see below for problem oriented charting. He returns for further follow-up. He complained of chronic intermittent right maxilla pain. He has not seen a dentist due to lack of insurance coverage. He complained of intermittent lower back pain after heavy duty work. The pain usually resolves after he rests. He complained of low energy level and is wondering whether a DHEA level would be helpful. His prior peripheral neuropathy is improving. He denies new lymphadenopathy. Denies recent infection, although he complained of urinary frequency and dysuria several weeks ago. His symptoms had resolved. He denies hematuria or passage of cloudy urine  REVIEW OF  SYSTEMS:   Constitutional: Denies fevers, chills or abnormal weight loss Eyes: Denies blurriness of vision Ears, nose, mouth, throat, and face: Denies mucositis or sore throat Respiratory: Denies cough, dyspnea or wheezes Cardiovascular: Denies palpitation, chest discomfort or lower extremity swelling Gastrointestinal:  Denies nausea, heartburn or change in bowel habits Skin: Denies abnormal skin rashes Lymphatics: Denies new lymphadenopathy or easy bruising Neurological:Denies numbness, tingling or new weaknesses Behavioral/Psych: Mood is stable, no new changes  All other systems were reviewed with the patient and are negative.  I have reviewed the past medical history, past surgical history, social history and family history with the patient and they are unchanged from previous note.  ALLERGIES:  is allergic to allopurinol.  MEDICATIONS:  Current Outpatient Prescriptions  Medication Sig Dispense Refill  . cholecalciferol (VITAMIN D) 1000 units tablet Take 1,000 Units by mouth daily.     No current facility-administered medications for this visit.   Facility-Administered Medications Ordered in Other Visits  Medication Dose Route Frequency Provider Last Rate Last Dose  . sodium chloride 0.9 % injection 10 mL  10 mL Intravenous PRN Artis Delay, MD   10 mL at 01/26/15 1346  . sodium chloride 0.9 % injection 10 mL  10 mL Intravenous PRN Artis Delay, MD        PHYSICAL EXAMINATION: ECOG PERFORMANCE STATUS: 1 - Symptomatic but completely ambulatory  Filed Vitals:   01/07/16 0859  BP: 144/80  Pulse: 60  Temp: 97.6 F (36.4 C)  Resp: 18   Filed Weights   01/07/16 0859  Weight: 143 lb 1.6 oz (64.91 kg)    GENERAL:alert, no distress and comfortable SKIN:  skin color, texture, turgor are normal, no rashes or significant lesions EYES: normal, Conjunctiva are pink and non-injected, sclera clear OROPHARYNX:no exudate, no erythema and lips, buccal mucosa, and tongue normal  NECK:  supple, thyroid normal size, non-tender, without nodularity LYMPH:  no palpable lymphadenopathy in the cervical, axillary or inguinal LUNGS: clear to auscultation and percussion with normal breathing effort HEART: regular rate & rhythm and no murmurs and no lower extremity edema ABDOMEN:abdomen soft, non-tender and normal bowel sounds Musculoskeletal:no cyanosis of digits and no clubbing  NEURO: alert & oriented x 3 with fluent speech, no focal motor/sensory deficits  LABORATORY DATA:  I have reviewed the data as listed    Component Value Date/Time   NA 142 09/06/2015 0813   NA 141 06/15/2015 0855   K 4.7 09/06/2015 0813   K 4.0 06/15/2015 0855   CL 106 06/15/2015 0855   CO2 28 09/06/2015 0813   CO2 26 06/15/2015 0855   GLUCOSE 84 09/06/2015 0813   GLUCOSE 84 06/15/2015 0855   BUN 14.2 09/06/2015 0813   BUN 15 06/15/2015 0855   CREATININE 0.9 09/06/2015 0813   CREATININE 0.70 06/15/2015 0855   CALCIUM 9.5 09/06/2015 0813   CALCIUM 9.0 06/15/2015 0855   PROT 6.9 09/06/2015 0813   PROT 5.9* 06/13/2010 1000   ALBUMIN 4.3 09/06/2015 0813   ALBUMIN 2.8* 06/13/2010 1000   AST 15 09/06/2015 0813   AST 109* 06/13/2010 1000   ALT <9 09/06/2015 0813   ALT 217* 06/13/2010 1000   ALKPHOS 91 09/06/2015 0813   ALKPHOS 211* 06/13/2010 1000   BILITOT 0.50 09/06/2015 0813   BILITOT 0.8 06/13/2010 1000   GFRNONAA >60 06/15/2015 0855   GFRAA >60 06/15/2015 0855    No results found for: SPEP, UPEP  Lab Results  Component Value Date   WBC 4.0 01/07/2016   NEUTROABS 2.3 01/07/2016   HGB 13.7 01/07/2016   HCT 41.2 01/07/2016   MCV 89.5 01/07/2016   PLT 207 01/07/2016      Chemistry      Component Value Date/Time   NA 142 09/06/2015 0813   NA 141 06/15/2015 0855   K 4.7 09/06/2015 0813   K 4.0 06/15/2015 0855   CL 106 06/15/2015 0855   CO2 28 09/06/2015 0813   CO2 26 06/15/2015 0855   BUN 14.2 09/06/2015 0813   BUN 15 06/15/2015 0855   CREATININE 0.9 09/06/2015 0813    CREATININE 0.70 06/15/2015 0855      Component Value Date/Time   CALCIUM 9.5 09/06/2015 0813   CALCIUM 9.0 06/15/2015 0855   ALKPHOS 91 09/06/2015 0813   ALKPHOS 211* 06/13/2010 1000   AST 15 09/06/2015 0813   AST 109* 06/13/2010 1000   ALT <9 09/06/2015 0813   ALT 217* 06/13/2010 1000   BILITOT 0.50 09/06/2015 0813   BILITOT 0.8 06/13/2010 1000     ASSESSMENT & PLAN:  History of B-cell lymphoma Clinical examination and blood work are unremarkable. I do not see signs and symptoms of disease recurrence. I do not recommend routine imaging study to follow. I will see him back in 4 months with history, physical examination and blood work only  Neuropathy due to chemotherapeutic drug [facility]) He felt very trace minor persistent neuropathy but much improved compared to last year. He does not need treatment. Recommend observation only  Chronic dental infection He has chronic intermittent right maxilla pain which I suspect is due to deep dental infection related to his implants. He has no evidence  of leukocytosis. I recommend dental evaluation. He does not need oral antibiotics now  Lumbar muscle pain He complained of intermittent muscular pain on his lower back that comes and goes. His chest examination is unremarkable. He does not have reproducible pain today. 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 I reassured the patient this is the natural course. His leukopenia has resolved. He is asking for a blood level of DHEA. I told him it is not indicated and his insurance company will not pay for the blood test. Again, I tried to reassure the patient   Orders Placed This Encounter  Procedures  . CBC with Differential/Platelet    Standing Status: Future     Number of Occurrences:      Standing Expiration Date:  02/10/2017  . Comprehensive metabolic panel    Standing Status: Future     Number of Occurrences:      Standing Expiration Date: 02/10/2017  . Lactate dehydrogenase (LDH)    Standing Status: Future     Number of Occurrences:      Standing Expiration Date: 02/10/2017   All questions were answered. The patient knows to call the clinic with any problems, questions or concerns. No barriers to learning was detected. I spent 15 minutes counseling the patient face to face. The total time spent in the appointment was 20 minutes and more than 50% was on counseling and review of test results     [address] [employer], [place], MD 01/07/2016 9:21 AM

## 2016-01-07 NOTE — Assessment & Plan Note (Signed)
He has chronic intermittent right maxilla pain which I suspect is due to deep dental infection related to his implants. He has no evidence of leukocytosis. I recommend dental evaluation. He does not need oral antibiotics now

## 2016-01-07 NOTE — Assessment & Plan Note (Signed)
Clinical examination and blood work are unremarkable. I do not see signs and symptoms of disease recurrence. I do not recommend routine imaging study to follow. I will see him back in 4 months with history, physical examination and blood work only

## 2016-01-07 NOTE — Assessment & Plan Note (Signed)
He complained of intermittent muscular pain on his lower back that comes and goes. His chest examination is unremarkable. He does not have reproducible pain today. 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 He is reassured 

## 2016-01-07 NOTE — Assessment & Plan Note (Signed)
He felt very trace minor persistent neuropathy but much improved compared to last year. He does not need treatment. Recommend observation only

## 2016-01-07 NOTE — Telephone Encounter (Signed)
Gave  Patient avs report and appointments for November.

## 2016-01-07 NOTE — Assessment & Plan Note (Signed)
He complained of chronic fatigue and felt that his energy level is "slow to recover" from prior chemotherapy. I reassured the patient this is the natural course. His leukopenia has resolved. He is asking for a blood level of DHEA. I told him it is not indicated and his insurance company will not pay for the blood test. Again, I tried to reassure the patient

## 2016-05-08 ENCOUNTER — Telehealth: Payer: Self-pay | Admitting: Hematology and Oncology

## 2016-05-08 NOTE — Telephone Encounter (Signed)
Returned call to patient to confirm next scheduled appointment. °

## 2016-05-09 ENCOUNTER — Telehealth: Payer: Self-pay | Admitting: Hematology and Oncology

## 2016-05-09 ENCOUNTER — Other Ambulatory Visit (HOSPITAL_BASED_OUTPATIENT_CLINIC_OR_DEPARTMENT_OTHER): Payer: BLUE CROSS/BLUE SHIELD

## 2016-05-09 ENCOUNTER — Ambulatory Visit (HOSPITAL_BASED_OUTPATIENT_CLINIC_OR_DEPARTMENT_OTHER): Payer: BLUE CROSS/BLUE SHIELD | Admitting: Hematology and Oncology

## 2016-05-09 ENCOUNTER — Encounter: Payer: Self-pay | Admitting: Hematology and Oncology

## 2016-05-09 VITALS — BP 143/79 | HR 61 | Temp 98.0°F | Resp 18 | Ht 67.0 in | Wt 145.0 lb

## 2016-05-09 DIAGNOSIS — Z8572 Personal history of non-Hodgkin lymphomas: Secondary | ICD-10-CM

## 2016-05-09 DIAGNOSIS — C8238 Follicular lymphoma grade IIIa, lymph nodes of multiple sites: Secondary | ICD-10-CM

## 2016-05-09 DIAGNOSIS — G620 Drug-induced polyneuropathy: Secondary | ICD-10-CM | POA: Diagnosis not present

## 2016-05-09 DIAGNOSIS — K047 Periapical abscess without sinus: Secondary | ICD-10-CM

## 2016-05-09 DIAGNOSIS — D72819 Decreased white blood cell count, unspecified: Secondary | ICD-10-CM | POA: Diagnosis not present

## 2016-05-09 DIAGNOSIS — T451X5A Adverse effect of antineoplastic and immunosuppressive drugs, initial encounter: Secondary | ICD-10-CM

## 2016-05-09 LAB — CBC WITH DIFFERENTIAL/PLATELET
BASO%: 0.5 % (ref 0.0–2.0)
BASOS ABS: 0 10*3/uL (ref 0.0–0.1)
EOS%: 4.1 % (ref 0.0–7.0)
Eosinophils Absolute: 0.2 10*3/uL (ref 0.0–0.5)
HCT: 40.9 % (ref 38.4–49.9)
HEMOGLOBIN: 13.9 g/dL (ref 13.0–17.1)
LYMPH%: 33.6 % (ref 14.0–49.0)
MCH: 30.4 pg (ref 27.2–33.4)
MCHC: 34 g/dL (ref 32.0–36.0)
MCV: 89.5 fL (ref 79.3–98.0)
MONO#: 0.3 10*3/uL (ref 0.1–0.9)
MONO%: 8 % (ref 0.0–14.0)
NEUT#: 2.1 10*3/uL (ref 1.5–6.5)
NEUT%: 53.8 % (ref 39.0–75.0)
Platelets: 206 10*3/uL (ref 140–400)
RBC: 4.57 10*6/uL (ref 4.20–5.82)
RDW: 13.5 % (ref 11.0–14.6)
WBC: 3.9 10*3/uL — AB (ref 4.0–10.3)
lymph#: 1.3 10*3/uL (ref 0.9–3.3)

## 2016-05-09 LAB — COMPREHENSIVE METABOLIC PANEL
ALBUMIN: 4.2 g/dL (ref 3.5–5.0)
ALK PHOS: 91 U/L (ref 40–150)
ALT: 15 U/L (ref 0–55)
AST: 23 U/L (ref 5–34)
Anion Gap: 9 mEq/L (ref 3–11)
BUN: 14.8 mg/dL (ref 7.0–26.0)
CHLORIDE: 107 meq/L (ref 98–109)
CO2: 27 meq/L (ref 22–29)
Calcium: 9.6 mg/dL (ref 8.4–10.4)
Creatinine: 0.8 mg/dL (ref 0.7–1.3)
EGFR: >90 mL/min/{1.73_m2} (ref >90)
GLUCOSE: 82 mg/dL (ref 70–140)
POTASSIUM: 4.1 meq/L (ref 3.5–5.1)
SODIUM: 142 meq/L (ref 136–145)
Total Bilirubin: 0.71 mg/dL (ref 0.20–1.20)
Total Protein: 7.1 g/dL (ref 6.4–8.3)

## 2016-05-09 LAB — LACTATE DEHYDROGENASE: LDH: 167 U/L (ref 125–245)

## 2016-05-09 NOTE — Assessment & Plan Note (Signed)
He felt very trace minor persistent neuropathy but much improved compared to last year. He does not need treatment. Recommend observation only

## 2016-05-09 NOTE — Telephone Encounter (Signed)
Appointments scheduled per 11/10 LOS. Patient given AVS report and calendars with future scheduled appointments. °

## 2016-05-09 NOTE — Assessment & Plan Note (Signed)
This is likely due to recent treatment. The patient denies recent history of fevers, cough, chills, diarrhea or dysuria. He is asymptomatic from the leukopenia. I will observe for now.  

## 2016-05-09 NOTE — Progress Notes (Signed)
[place] OFFICE PROGRESS NOTE  Patient Care Team: Leighton Ruff, MD as PCP - General (Family Medicine) Melissa Montane, MD as Consulting Physician (Otolaryngology) Heath Lark, MD as Consulting Physician (Hematology and Oncology)  SUMMARY OF ONCOLOGIC HISTORY: Oncology History   Follicular lymphoma grade 3a   Staging form: Lymphoid Neoplasms, AJCC 6th Edition     Clinical stage from 12/27/2014: Stage III - Signed by Heath Lark, MD on 12/27/2014        History of B-cell lymphoma   10/31/2014 Pathology Results    Accession: [id number] showed atypical lymphoid population      11/28/2014 Pathology Results    Accession: [id number] Biopsy showed high grade follicular lymphoma      11/28/2014 Surgery    He had open biopsy of left neck mass      12/22/2014 Imaging    [facility] showed normal EF      12/22/2014 Imaging    PET scan showed stage III disease      12/22/2014 Bone Marrow Biopsy    Bone marrow biopsy was negative      01/05/2015 - 05/10/2015 Chemotherapy    He received R- CHOP chemotherapy x 6 cycles      02/14/2015 Imaging    PEt CT scan showed near complete response to Rx      04/19/2015 Adverse Reaction     he has peripheral neuropathy. Dose of vincristine is reduced      06/06/2015 Imaging    PET CT showed persistent lymphadenopathy but not hypermetabolic       INTERVAL HISTORY: Please see below for problem oriented charting. He returns for follow-up. He denies new lymphadenopathy. He continues to have maxillary pain from prior dental implant He denies recent fever or chills. He declined influenza vaccination Peripheral neuropathy remain the same, slightly improved  REVIEW OF SYSTEMS:   Constitutional: Denies fevers, chills or abnormal weight loss Eyes: Denies blurriness of vision Ears, nose, mouth, throat, and face: Denies mucositis or sore throat Respiratory: Denies cough, dyspnea or wheezes Cardiovascular: Denies palpitation, chest  discomfort or lower extremity swelling Gastrointestinal:  Denies nausea, heartburn or change in bowel habits Skin: Denies abnormal skin rashes Lymphatics: Denies new lymphadenopathy or easy bruising Neurological:Denies numbness, tingling or new weaknesses Behavioral/Psych: Mood is stable, no new changes  All other systems were reviewed with the patient and are negative.  I have reviewed the past medical history, past surgical history, social history and family history with the patient and they are unchanged from previous note.  ALLERGIES:  is allergic to allopurinol.  MEDICATIONS:  Current Outpatient Prescriptions  Medication Sig Dispense Refill  . cholecalciferol (VITAMIN D) 1000 units tablet Take 1,000 Units by mouth daily.     No current facility-administered medications for this visit.    Facility-Administered Medications Ordered in Other Visits  Medication Dose Route Frequency Provider Last Rate Last Dose  . sodium chloride 0.9 % injection 10 mL  10 mL Intravenous PRN Heath Lark, MD   10 mL at 01/26/15 1346  . sodium chloride 0.9 % injection 10 mL  10 mL Intravenous PRN Heath Lark, MD        PHYSICAL EXAMINATION: ECOG PERFORMANCE STATUS: 0 - Asymptomatic  Vitals:   05/09/16 0920  BP: (!) 143/79  Pulse: 61  Resp: 18  Temp: 98 F (36.7 C)   Filed Weights   05/09/16 0920  Weight: 145 lb (65.8 kg)    GENERAL:alert, no distress and comfortable SKIN: skin color, texture, turgor  are normal, no rashes or significant lesions EYES: normal, Conjunctiva are pink and non-injected, sclera clear OROPHARYNX:no exudate, no erythema and lips, buccal mucosa, and tongue normal  NECK: supple, thyroid normal size, non-tender, without nodularity LYMPH:  no palpable lymphadenopathy in the cervical, axillary or inguinal LUNGS: clear to auscultation and percussion with normal breathing effort HEART: regular rate & rhythm and no murmurs and no lower extremity edema ABDOMEN:abdomen soft,  non-tender and normal bowel sounds Musculoskeletal:no cyanosis of digits and no clubbing  NEURO: alert & oriented x 3 with fluent speech, no focal motor/sensory deficits  LABORATORY DATA:  I have reviewed the data as listed    Component Value Date/Time   NA 142 05/09/2016 0903   K 4.1 05/09/2016 0903   CL 106 06/15/2015 0855   CO2 27 05/09/2016 0903   GLUCOSE 82 05/09/2016 0903   BUN 14.8 05/09/2016 0903   CREATININE 0.8 05/09/2016 0903   CALCIUM 9.6 05/09/2016 0903   PROT 7.1 05/09/2016 0903   ALBUMIN 4.2 05/09/2016 0903   AST 23 05/09/2016 0903   ALT 15 05/09/2016 0903   ALKPHOS 91 05/09/2016 0903   BILITOT 0.71 05/09/2016 0903   GFRNONAA >60 06/15/2015 0855   GFRAA >60 06/15/2015 0855    No results found for: SPEP, UPEP  Lab Results  Component Value Date   WBC 3.9 (L) 05/09/2016   NEUTROABS 2.1 05/09/2016   HGB 13.9 05/09/2016   HCT 40.9 05/09/2016   MCV 89.5 05/09/2016   PLT 206 05/09/2016      Chemistry      Component Value Date/Time   NA 142 05/09/2016 0903   K 4.1 05/09/2016 0903   CL 106 06/15/2015 0855   CO2 27 05/09/2016 0903   BUN 14.8 05/09/2016 0903   CREATININE 0.8 05/09/2016 0903      Component Value Date/Time   CALCIUM 9.6 05/09/2016 0903   ALKPHOS 91 05/09/2016 0903   AST 23 05/09/2016 0903   ALT 15 05/09/2016 0903   BILITOT 0.71 05/09/2016 0903      ASSESSMENT & PLAN:  History of B-cell lymphoma Clinical examination and blood work are unremarkable. I do not see signs and symptoms of disease recurrence. I do not recommend routine imaging study to follow. I will see him back in 4 months with history, physical examination and blood work only  Chronic dental infection He has chronic intermittent right maxilla pain which I suspect is due to deep dental infection related to his implants. He has no evidence of leukocytosis. I recommend dental evaluation. He does not need oral antibiotics now  Neuropathy due to chemotherapeutic drug  [facility]) He felt very trace minor persistent neuropathy but much improved compared to last year. He does not need treatment. Recommend observation only  Chronic leukopenia This is likely due to recent treatment. The patient denies recent history of fevers, cough, chills, diarrhea or dysuria. He is asymptomatic from the leukopenia. I will observe for now.   Orders Placed This Encounter  Procedures  . Comprehensive metabolic panel    Standing Status:   Future    Standing Expiration Date:   06/13/2017  . CBC with Differential/Platelet    Standing Status:   Future    Standing Expiration Date:   06/13/2017  . Lactate dehydrogenase    Standing Status:   Future    Standing Expiration Date:   06/13/2017   All questions were answered. The patient knows to call the clinic with any problems, questions or concerns. No barriers  to learning was detected. I spent 15 minutes counseling the patient face to face. The total time spent in the appointment was 20 minutes and more than 50% was on counseling and review of test results     Heath Lark, MD 05/09/2016 10:08 AM

## 2016-05-09 NOTE — Assessment & Plan Note (Signed)
Clinical examination and blood work are unremarkable. I do not see signs and symptoms of disease recurrence. I do not recommend routine imaging study to follow. I will see him back in 4 months with history, physical examination and blood work only

## 2016-05-09 NOTE — Assessment & Plan Note (Signed)
He has chronic intermittent right maxilla pain which I suspect is due to deep dental infection related to his implants. He has no evidence of leukocytosis. I recommend dental evaluation. He does not need oral antibiotics now

## 2016-09-05 ENCOUNTER — Ambulatory Visit (HOSPITAL_BASED_OUTPATIENT_CLINIC_OR_DEPARTMENT_OTHER): Payer: BLUE CROSS/BLUE SHIELD | Admitting: Hematology and Oncology

## 2016-09-05 ENCOUNTER — Other Ambulatory Visit (HOSPITAL_BASED_OUTPATIENT_CLINIC_OR_DEPARTMENT_OTHER): Payer: BLUE CROSS/BLUE SHIELD

## 2016-09-05 ENCOUNTER — Telehealth: Payer: Self-pay | Admitting: Hematology and Oncology

## 2016-09-05 ENCOUNTER — Encounter: Payer: Self-pay | Admitting: Hematology and Oncology

## 2016-09-05 DIAGNOSIS — Z8572 Personal history of non-Hodgkin lymphomas: Secondary | ICD-10-CM | POA: Diagnosis not present

## 2016-09-05 DIAGNOSIS — M545 Low back pain: Secondary | ICD-10-CM

## 2016-09-05 DIAGNOSIS — M7918 Myalgia, other site: Secondary | ICD-10-CM

## 2016-09-05 LAB — CBC WITH DIFFERENTIAL/PLATELET
BASO%: 0.6 % (ref 0.0–2.0)
Basophils Absolute: 0 10*3/uL (ref 0.0–0.1)
EOS%: 3.1 % (ref 0.0–7.0)
Eosinophils Absolute: 0.1 10*3/uL (ref 0.0–0.5)
HEMATOCRIT: 46 % (ref 38.4–49.9)
HGB: 15.5 g/dL (ref 13.0–17.1)
LYMPH#: 1.3 10*3/uL (ref 0.9–3.3)
LYMPH%: 28.4 % (ref 14.0–49.0)
MCH: 30.2 pg (ref 27.2–33.4)
MCHC: 33.6 g/dL (ref 32.0–36.0)
MCV: 89.9 fL (ref 79.3–98.0)
MONO#: 0.4 10*3/uL (ref 0.1–0.9)
MONO%: 9.1 % (ref 0.0–14.0)
NEUT#: 2.7 10*3/uL (ref 1.5–6.5)
NEUT%: 58.8 % (ref 39.0–75.0)
PLATELETS: 222 10*3/uL (ref 140–400)
RBC: 5.12 10*6/uL (ref 4.20–5.82)
RDW: 13.6 % (ref 11.0–14.6)
WBC: 4.6 10*3/uL (ref 4.0–10.3)

## 2016-09-05 LAB — COMPREHENSIVE METABOLIC PANEL
ALBUMIN: 4.8 g/dL (ref 3.5–5.0)
ALK PHOS: 99 U/L (ref 40–150)
ALT: 17 U/L (ref 0–55)
ANION GAP: 10 meq/L (ref 3–11)
AST: 19 U/L (ref 5–34)
BUN: 12.6 mg/dL (ref 7.0–26.0)
CO2: 29 meq/L (ref 22–29)
Calcium: 10.1 mg/dL (ref 8.4–10.4)
Chloride: 103 mEq/L (ref 98–109)
Creatinine: 0.9 mg/dL (ref 0.7–1.3)
EGFR: 89 mL/min/{1.73_m2} — AB (ref >90)
GLUCOSE: 85 mg/dL (ref 70–140)
POTASSIUM: 3.8 meq/L (ref 3.5–5.1)
SODIUM: 142 meq/L (ref 136–145)
Total Bilirubin: 0.81 mg/dL (ref 0.20–1.20)
Total Protein: 7.5 g/dL (ref 6.4–8.3)

## 2016-09-05 LAB — LACTATE DEHYDROGENASE: LDH: 142 U/L (ref 125–245)

## 2016-09-05 NOTE — Telephone Encounter (Signed)
Appointments scheduled per 3/9 LOS. Patient requested to come at [facility] instead of at [facility]. Patient given AVS report and calendars with future scheduled appointments.

## 2016-09-05 NOTE — Progress Notes (Signed)
[facility] OFFICE PROGRESS NOTE  Patient Care Team: Leighton Ruff, MD as PCP - General (Family Medicine) Melissa Montane, MD as Consulting Physician (Otolaryngology) Heath Lark, MD as Consulting Physician (Hematology and Oncology)  SUMMARY OF ONCOLOGIC HISTORY: Oncology History   Follicular lymphoma grade 3a   Staging form: Lymphoid Neoplasms, AJCC 6th Edition     Clinical stage from 12/27/2014: Stage III - Signed by Heath Lark, MD on 12/27/2014        History of B-cell lymphoma   10/31/2014 Pathology Results    Accession: [id number] showed atypical lymphoid population      11/28/2014 Pathology Results    Accession: [id number] Biopsy showed high grade follicular lymphoma      11/28/2014 Surgery    He had open biopsy of left neck mass      12/22/2014 Imaging    [facility] showed normal EF      12/22/2014 Imaging    PET scan showed stage III disease      12/22/2014 Bone Marrow Biopsy    Bone marrow biopsy was negative      01/05/2015 - 05/10/2015 Chemotherapy    He received R- CHOP chemotherapy x 6 cycles      02/14/2015 Imaging    PEt CT scan showed near complete response to Rx      04/19/2015 Adverse Reaction     he has peripheral neuropathy. Dose of vincristine is reduced      06/06/2015 Imaging    PET CT showed persistent lymphadenopathy but not hypermetabolic       INTERVAL HISTORY: Please see below for problem oriented charting. He returns for further follow-up. He continues to complain of intermittent back discomfort that comes and goes. No lymphadenopathy. He denies recent infection. No residual peripheral neuropathy  REVIEW OF SYSTEMS:   Constitutional: Denies fevers, chills or abnormal weight loss Eyes: Denies blurriness of vision Ears, nose, mouth, throat, and face: Denies mucositis or sore throat Respiratory: Denies cough, dyspnea or wheezes Cardiovascular: Denies palpitation, chest discomfort or lower extremity  swelling Gastrointestinal:  Denies nausea, heartburn or change in bowel habits Skin: Denies abnormal skin rashes Lymphatics: Denies new lymphadenopathy or easy bruising Neurological:Denies numbness, tingling or new weaknesses Behavioral/Psych: Mood is stable, no new changes  All other systems were reviewed with the patient and are negative.  I have reviewed the past medical history, past surgical history, social history and family history with the patient and they are unchanged from previous note.  ALLERGIES:  is allergic to allopurinol.  MEDICATIONS:  Current Outpatient Prescriptions  Medication Sig Dispense Refill  . cholecalciferol (VITAMIN D) 1000 units tablet Take 1,000 Units by mouth daily.     No current facility-administered medications for this visit.    Facility-Administered Medications Ordered in Other Visits  Medication Dose Route Frequency Provider Last Rate Last Dose  . sodium chloride 0.9 % injection 10 mL  10 mL Intravenous PRN Heath Lark, MD   10 mL at 01/26/15 1346  . sodium chloride 0.9 % injection 10 mL  10 mL Intravenous PRN Heath Lark, MD        PHYSICAL EXAMINATION: ECOG PERFORMANCE STATUS: 0 - Asymptomatic  Vitals:   09/05/16 0847  BP: 131/76  Pulse: 62  Resp: 18  Temp: 97.6 F (36.4 C)   Filed Weights   09/05/16 0847  Weight: 146 lb 8 oz (66.5 kg)    GENERAL:alert, no distress and comfortable SKIN: skin color, texture, turgor are normal, no rashes or significant lesions  EYES: normal, Conjunctiva are pink and non-injected, sclera clear OROPHARYNX:no exudate, no erythema and lips, buccal mucosa, and tongue normal  NECK: supple, thyroid normal size, non-tender, without nodularity LYMPH:  no palpable lymphadenopathy in the cervical, axillary or inguinal LUNGS: clear to auscultation and percussion with normal breathing effort HEART: regular rate & rhythm and no murmurs and no lower extremity edema ABDOMEN:abdomen soft, non-tender and normal bowel  sounds Musculoskeletal:no cyanosis of digits and no clubbing  NEURO: alert & oriented x 3 with fluent speech, no focal motor/sensory deficits  LABORATORY DATA:  I have reviewed the data as listed    Component Value Date/Time   NA 142 09/05/2016 0831   K 3.8 09/05/2016 0831   CL 106 06/15/2015 0855   CO2 29 09/05/2016 0831   GLUCOSE 85 09/05/2016 0831   BUN 12.6 09/05/2016 0831   CREATININE 0.9 09/05/2016 0831   CALCIUM 10.1 09/05/2016 0831   PROT 7.5 09/05/2016 0831   ALBUMIN 4.8 09/05/2016 0831   AST 19 09/05/2016 0831   ALT 17 09/05/2016 0831   ALKPHOS 99 09/05/2016 0831   BILITOT 0.81 09/05/2016 0831   GFRNONAA >60 06/15/2015 0855   GFRAA >60 06/15/2015 0855    No results found for: SPEP, UPEP  Lab Results  Component Value Date   WBC 4.6 09/05/2016   NEUTROABS 2.7 09/05/2016   HGB 15.5 09/05/2016   HCT 46.0 09/05/2016   MCV 89.9 09/05/2016   PLT 222 09/05/2016      Chemistry      Component Value Date/Time   NA 142 09/05/2016 0831   K 3.8 09/05/2016 0831   CL 106 06/15/2015 0855   CO2 29 09/05/2016 0831   BUN 12.6 09/05/2016 0831   CREATININE 0.9 09/05/2016 0831      Component Value Date/Time   CALCIUM 10.1 09/05/2016 0831   ALKPHOS 99 09/05/2016 0831   AST 19 09/05/2016 0831   ALT 17 09/05/2016 0831   BILITOT 0.81 09/05/2016 0831      ASSESSMENT & PLAN:  History of B-cell lymphoma Clinical examination and blood work are unremarkable. I do not see signs and symptoms of disease recurrence. I do not recommend routine imaging study to follow. I will see him back in 6 months with history, physical examination and blood work only  Lumbar muscle pain He complained of intermittent muscular pain on his lower back that comes and goes. His chest examination is unremarkable. He does not have reproducible pain today. 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  All questions were answered. The patient knows to call the clinic with any problems, questions or concerns. No barriers to learning was detected. I spent 15 minutes counseling the patient face to face. The total time spent in the appointment was 20 minutes and more than 50% was on counseling and review of test results     Heath Lark, MD 09/05/2016 2:09 PM

## 2016-09-05 NOTE — Assessment & Plan Note (Signed)
He complained of intermittent muscular pain on his lower back that comes and goes. His chest examination is unremarkable. He does not have reproducible pain today. 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

## 2016-09-05 NOTE — Assessment & Plan Note (Signed)
Clinical examination and blood work are unremarkable. I do not see signs and symptoms of disease recurrence. I do not recommend routine imaging study to follow. I will see him back in 6 months with history, physical examination and blood work only

## 2017-03-05 ENCOUNTER — Other Ambulatory Visit: Payer: Self-pay | Admitting: Hematology and Oncology

## 2017-03-05 DIAGNOSIS — Z8572 Personal history of non-Hodgkin lymphomas: Secondary | ICD-10-CM

## 2017-03-06 ENCOUNTER — Encounter: Payer: Self-pay | Admitting: Hematology and Oncology

## 2017-03-06 ENCOUNTER — Ambulatory Visit (HOSPITAL_BASED_OUTPATIENT_CLINIC_OR_DEPARTMENT_OTHER): Payer: BLUE CROSS/BLUE SHIELD | Admitting: Hematology and Oncology

## 2017-03-06 ENCOUNTER — Telehealth: Payer: Self-pay | Admitting: Hematology and Oncology

## 2017-03-06 ENCOUNTER — Other Ambulatory Visit (HOSPITAL_BASED_OUTPATIENT_CLINIC_OR_DEPARTMENT_OTHER): Payer: BLUE CROSS/BLUE SHIELD

## 2017-03-06 VITALS — BP 122/69 | HR 66 | Temp 97.8°F | Resp 18 | Wt 150.4 lb

## 2017-03-06 DIAGNOSIS — Z8572 Personal history of non-Hodgkin lymphomas: Secondary | ICD-10-CM | POA: Diagnosis not present

## 2017-03-06 DIAGNOSIS — M545 Low back pain: Secondary | ICD-10-CM

## 2017-03-06 DIAGNOSIS — M7918 Myalgia, other site: Secondary | ICD-10-CM

## 2017-03-06 DIAGNOSIS — M791 Myalgia: Secondary | ICD-10-CM | POA: Diagnosis not present

## 2017-03-06 DIAGNOSIS — R1909 Other intra-abdominal and pelvic swelling, mass and lump: Secondary | ICD-10-CM | POA: Diagnosis not present

## 2017-03-06 LAB — COMPREHENSIVE METABOLIC PANEL
ALBUMIN: 4.5 g/dL (ref 3.5–5.0)
ALT: 13 U/L (ref 0–55)
ANION GAP: 8 meq/L (ref 3–11)
AST: 18 U/L (ref 5–34)
Alkaline Phosphatase: 89 U/L (ref 40–150)
BILIRUBIN TOTAL: 0.8 mg/dL (ref 0.20–1.20)
BUN: 18.3 mg/dL (ref 7.0–26.0)
CHLORIDE: 104 meq/L (ref 98–109)
CO2: 27 meq/L (ref 22–29)
Calcium: 9.8 mg/dL (ref 8.4–10.4)
Creatinine: 0.9 mg/dL (ref 0.7–1.3)
GLUCOSE: 85 mg/dL (ref 70–140)
Potassium: 4.2 mEq/L (ref 3.5–5.1)
SODIUM: 139 meq/L (ref 136–145)
Total Protein: 7 g/dL (ref 6.4–8.3)

## 2017-03-06 LAB — CBC WITH DIFFERENTIAL/PLATELET
BASO%: 0.8 % (ref 0.0–2.0)
BASOS ABS: 0 10*3/uL (ref 0.0–0.1)
EOS ABS: 0.1 10*3/uL (ref 0.0–0.5)
EOS%: 2.7 % (ref 0.0–7.0)
HEMATOCRIT: 42.6 % (ref 38.4–49.9)
HEMOGLOBIN: 14.3 g/dL (ref 13.0–17.1)
LYMPH#: 1.3 10*3/uL (ref 0.9–3.3)
LYMPH%: 26.5 % (ref 14.0–49.0)
MCH: 29.9 pg (ref 27.2–33.4)
MCHC: 33.5 g/dL (ref 32.0–36.0)
MCV: 89.1 fL (ref 79.3–98.0)
MONO#: 0.5 10*3/uL (ref 0.1–0.9)
MONO%: 9.2 % (ref 0.0–14.0)
NEUT#: 3.1 10*3/uL (ref 1.5–6.5)
NEUT%: 60.8 % (ref 39.0–75.0)
PLATELETS: 238 10*3/uL (ref 140–400)
RBC: 4.79 10*6/uL (ref 4.20–5.82)
RDW: 14.2 % (ref 11.0–14.6)
WBC: 5.1 10*3/uL (ref 4.0–10.3)

## 2017-03-06 LAB — LACTATE DEHYDROGENASE: LDH: 158 U/L (ref 125–245)

## 2017-03-06 NOTE — Assessment & Plan Note (Signed)
He has occasional muscular pain of unknown etiology Recommend observation only.

## 2017-03-06 NOTE — Assessment & Plan Note (Signed)
He has intermittent discomfort that comes and goes As above, I will plan to repeat imaging study first for evaluation.

## 2017-03-06 NOTE — Progress Notes (Signed)
[facility] OFFICE PROGRESS NOTE  Patient Care Team: Leighton Ruff, MD as PCP - General (Family Medicine) Melissa Montane, MD as Consulting Physician (Otolaryngology) Heath Lark, MD as Consulting Physician (Hematology and Oncology)  SUMMARY OF ONCOLOGIC HISTORY: Oncology History   Follicular lymphoma grade 3a   Staging form: Lymphoid Neoplasms, AJCC 6th Edition     Clinical stage from 12/27/2014: Stage III - Signed by Heath Lark, MD on 12/27/2014        History of B-cell lymphoma   10/31/2014 Pathology Results    Accession: [id number] FNA showed atypical lymphoid population      11/28/2014 Pathology Results    Accession: [id number] Biopsy showed high grade follicular lymphoma      11/28/2014 Surgery    He had open biopsy of left neck mass      12/22/2014 Imaging    E[facility]o showed normal EF      12/22/2014 Imaging    PET scan showed stage III disease      12/22/2014 Bone Marrow Biopsy    Bone marrow biopsy was negative      01/05/2015 - 05/10/2015 Chemotherapy    He received R- CHOP chemotherapy x 6 cycles      02/14/2015 Imaging    PEt CT scan showed near complete response to Rx      04/19/2015 Adverse Reaction     he has peripheral neuropathy. Dose of vincristine is reduced      06/06/2015 Imaging    PET CT showed persistent lymphadenopathy but not hypermetabolic       INTERVAL HISTORY: Please see below for problem oriented charting. He returns for further follow-up He complained of intermittent abdominal discomfort that comes and goes No nausea Denies recent weight loss No change in bowel habits The patient never had screening colonoscopy before No other new lymphadenopathy.  REVIEW OF SYSTEMS:   Constitutional: Denies fevers, chills or abnormal weight loss Eyes: Denies blurriness of vision Ears, nose, mouth, throat, and face: Denies mucositis or sore throat Respiratory: Denies cough, dyspnea or wheezes Cardiovascular: Denies palpitation,  chest discomfort or lower extremity swelling Gastrointestinal:  Denies nausea, heartburn or change in bowel habits Skin: Denies abnormal skin rashes Lymphatics: Denies new lymphadenopathy or easy bruising Neurological:Denies numbness, tingling or new weaknesses Behavioral/Psych: Mood is stable, no new changes  All other systems were reviewed with the patient and are negative.  I have reviewed the past medical history, past surgical history, social history and family history with the patient and they are unchanged from previous note.  ALLERGIES:  is allergic to allopurinol.  MEDICATIONS:  Current Outpatient Prescriptions  Medication Sig Dispense Refill  . cholecalciferol (VITAMIN D) 1000 units tablet Take 1,000 Units by mouth daily.     No current facility-administered medications for this visit.    Facility-Administered Medications Ordered in Other Visits  Medication Dose Route Frequency Provider Last Rate Last Dose  . sodium chloride 0.9 % injection 10 mL  10 mL Intravenous PRN Alvy Bimler, Rani Sisney, MD   10 mL at 01/26/15 1346  . sodium chloride 0.9 % injection 10 mL  10 mL Intravenous PRN Alvy Bimler, Haedyn Ancrum, MD        PHYSICAL EXAMINATION: ECOG PERFORMANCE STATUS: 1 - Symptomatic but completely ambulatory  Vitals:   03/06/17 0930  BP: 122/69  Pulse: 66  Resp: 18  Temp: 97.8 F (36.6 C)  SpO2: 100%   Filed Weights   03/06/17 0930  Weight: 150 lb 6.4 oz (68.2 kg)    GENERAL:alert,  no distress and comfortable SKIN: skin color, texture, turgor are normal, no rashes or significant lesions EYES: normal, Conjunctiva are pink and non-injected, sclera clear OROPHARYNX:no exudate, no erythema and lips, buccal mucosa, and tongue normal  NECK: supple, thyroid normal size, non-tender, without nodularity LYMPH:  no palpable lymphadenopathy in the cervical, axillary or inguinal LUNGS: clear to auscultation and percussion with normal breathing effort HEART: regular rate & rhythm and no murmurs and  no lower extremity edema ABDOMEN:abdomen soft, non-tender and normal bowel sounds Musculoskeletal:no cyanosis of digits and no clubbing  NEURO: alert & oriented x 3 with fluent speech, no focal motor/sensory deficits  LABORATORY DATA:  I have reviewed the data as listed    Component Value Date/Time   NA 139 03/06/2017 0920   K 4.2 03/06/2017 0920   CL 106 06/15/2015 0855   CO2 27 03/06/2017 0920   GLUCOSE 85 03/06/2017 0920   BUN 18.3 03/06/2017 0920   CREATININE 0.9 03/06/2017 0920   CALCIUM 9.8 03/06/2017 0920   PROT 7.0 03/06/2017 0920   ALBUMIN 4.5 03/06/2017 0920   AST 18 03/06/2017 0920   ALT 13 03/06/2017 0920   ALKPHOS 89 03/06/2017 0920   BILITOT 0.80 03/06/2017 0920   GFRNONAA >60 06/15/2015 0855   GFRAA >60 06/15/2015 0855    No results found for: SPEP, UPEP  Lab Results  Component Value Date   WBC 5.1 03/06/2017   NEUTROABS 3.1 03/06/2017   HGB 14.3 03/06/2017   HCT 42.6 03/06/2017   MCV 89.1 03/06/2017   PLT 238 03/06/2017      Chemistry      Component Value Date/Time   NA 139 03/06/2017 0920   K 4.2 03/06/2017 0920   CL 106 06/15/2015 0855   CO2 27 03/06/2017 0920   BUN 18.3 03/06/2017 0920   CREATININE 0.9 03/06/2017 0920      Component Value Date/Time   CALCIUM 9.8 03/06/2017 0920   ALKPHOS 89 03/06/2017 0920   AST 18 03/06/2017 0920   ALT 13 03/06/2017 0920   BILITOT 0.80 03/06/2017 0920      ASSESSMENT & PLAN:  History of B-cell lymphoma He complained of intermittent abdominal discomfort Abdominal exam reveals palpable fullness in the periumbilical region The patient never had screening colonoscopy and is refusing GI workup I am concerned about cancer recurrence and recommend PET CT scan for evaluation and he agreed to proceed.  Central abdominal mass He has intermittent discomfort that comes and goes As above, I will plan to repeat imaging study first for evaluation.  Lumbar muscle pain He has occasional muscular pain of unknown  etiology Recommend observation only.   Orders Placed This Encounter  Procedures  . NM PET Image Restag (PS) Skull Base To Thigh    Standing Status:   Future    Standing Expiration Date:   03/06/2018    Order Specific Question:   If indicated for the ordered procedure, I authorize the administration of a radiopharmaceutical per Radiology protocol    Answer:   Yes    Order Specific Question:   Preferred imaging location?    Answer:   [facility]    Order Specific Question:   Radiology Contrast Protocol - do NOT remove file path    Answer:   [email or web address]   All questions were answered. The patient knows to call the clinic with any problems, questions or concerns. No barriers to learning was detected. I spent 15 minutes counseling the patient face to face. The total  time spent in the appointment was 20 minutes and more than 50% was on counseling and review of test results     Heath Lark, MD 03/06/2017 1:00 PM

## 2017-03-06 NOTE — Assessment & Plan Note (Signed)
He complained of intermittent abdominal discomfort Abdominal exam reveals palpable fullness in the periumbilical region The patient never had screening colonoscopy and is refusing GI workup I am concerned about cancer recurrence and recommend PET CT scan for evaluation and he agreed to proceed.

## 2017-03-06 NOTE — Telephone Encounter (Signed)
Gave patient AVS and calendar of upcoming September appointment. °

## 2017-03-11 ENCOUNTER — Telehealth: Payer: Self-pay | Admitting: *Deleted

## 2017-03-11 NOTE — Telephone Encounter (Signed)
-----   Message from Heath Lark, MD sent at 03/11/2017  2:08 PM EDT ----- Regarding: FW: PET scan appointment change Hi [name], Please call the patient. Proceed to cancel his appt to see me Monday Tell him I will call him after PET scan result is available ----- Message ----- From: [name] Sent: 03/11/2017  11:31 AM To: Heath Lark, MD Subject: PET scan appointment change                    Dr. Alvy Bimler,  I wanted to inform you that Mr. [facility]'s appointment had to be changed to 03/20/17.  He was scheduled to have the PET scan on Saturday 03/14/17, but it had to be rescheduled due to weather conditions this weekend.   Thank you, [facility]uling

## 2017-03-11 NOTE — Telephone Encounter (Signed)
Notified of message below. Verbalized understanding 

## 2017-03-14 ENCOUNTER — Encounter (HOSPITAL_COMMUNITY): Payer: BLUE CROSS/BLUE SHIELD

## 2017-03-16 ENCOUNTER — Ambulatory Visit: Payer: BLUE CROSS/BLUE SHIELD | Admitting: Hematology and Oncology

## 2017-03-20 ENCOUNTER — Encounter (HOSPITAL_COMMUNITY)
Admission: RE | Admit: 2017-03-20 | Discharge: 2017-03-20 | Disposition: A | Discharge status: Home / Self Care | Payer: BLUE CROSS/BLUE SHIELD | Source: Ambulatory Visit | Attending: Hematology and Oncology | Admitting: Hematology and Oncology

## 2017-03-20 DIAGNOSIS — Z8572 Personal history of non-Hodgkin lymphomas: Secondary | ICD-10-CM | POA: Insufficient documentation

## 2017-03-20 LAB — GLUCOSE, CAPILLARY: GLUCOSE-CAPILLARY: 87 mg/dL (ref 65–99)

## 2017-03-20 MED ORDER — FLUDEOXYGLUCOSE F - 18 (FDG) INJECTION
7.6000 | Freq: Once | INTRAVENOUS | Status: AC | PRN
  Administered 2017-03-20: 7.6 via INTRAVENOUS

## 2017-03-23 ENCOUNTER — Telehealth: Payer: Self-pay | Admitting: Hematology and Oncology

## 2017-03-23 NOTE — Telephone Encounter (Signed)
I reviewed PET CT scan There is no signs of lymphoma I recommend colonoscopy screening test I will see him back next year

## 2017-03-24 ENCOUNTER — Telehealth: Payer: Self-pay | Admitting: Hematology and Oncology

## 2017-03-24 NOTE — Telephone Encounter (Signed)
Scheduled appt per 9/24 sch message - reminder letter sent in the mail . Lab and f/u August 2019

## 2017-11-02 ENCOUNTER — Telehealth: Payer: Self-pay | Admitting: *Deleted

## 2017-11-02 ENCOUNTER — Telehealth: Payer: Self-pay | Admitting: Hematology and Oncology

## 2017-11-02 NOTE — Telephone Encounter (Signed)
Spoke to patient regarding upcoming may appointments per 5/6 sch message.

## 2017-11-02 NOTE — Telephone Encounter (Signed)
I don;t have anything this week I have opening next week Send in scheduling msg only with labs after

## 2017-11-02 NOTE — Telephone Encounter (Signed)
[facility] states he has an appt with Dr Alvy Bimler on 8/5, but would like to be seen this week of possible. States he has been having night sweats, and the past 3 nights he has not been able to sleep due to lower back and abdominal pain.

## 2017-11-05 ENCOUNTER — Other Ambulatory Visit: Payer: Self-pay | Admitting: Hematology and Oncology

## 2017-11-05 DIAGNOSIS — Z8572 Personal history of non-Hodgkin lymphomas: Secondary | ICD-10-CM

## 2017-11-09 ENCOUNTER — Encounter: Payer: Self-pay | Admitting: Hematology and Oncology

## 2017-11-09 ENCOUNTER — Telehealth: Payer: Self-pay | Admitting: Hematology and Oncology

## 2017-11-09 ENCOUNTER — Inpatient Hospital Stay (HOSPITAL_BASED_OUTPATIENT_CLINIC_OR_DEPARTMENT_OTHER): Payer: BLUE CROSS/BLUE SHIELD | Admitting: Hematology and Oncology

## 2017-11-09 ENCOUNTER — Inpatient Hospital Stay: Payer: BLUE CROSS/BLUE SHIELD | Attending: Hematology and Oncology

## 2017-11-09 DIAGNOSIS — Z8572 Personal history of non-Hodgkin lymphomas: Secondary | ICD-10-CM | POA: Diagnosis present

## 2017-11-09 DIAGNOSIS — R030 Elevated blood-pressure reading, without diagnosis of hypertension: Secondary | ICD-10-CM | POA: Insufficient documentation

## 2017-11-09 LAB — COMPREHENSIVE METABOLIC PANEL
ALT: 17 U/L (ref 0–55)
AST: 16 U/L (ref 5–34)
Albumin: 4.4 g/dL (ref 3.5–5.0)
Alkaline Phosphatase: 85 U/L (ref 40–150)
Anion gap: 8 (ref 3–11)
BUN: 15 mg/dL (ref 7–26)
CALCIUM: 9.3 mg/dL (ref 8.4–10.4)
CO2: 28 mmol/L (ref 22–29)
Chloride: 105 mmol/L (ref 98–109)
Creatinine, Ser: 0.88 mg/dL (ref 0.70–1.30)
GFR calc Af Amer: >60 mL/min (ref >60)
GLUCOSE: 86 mg/dL (ref 70–140)
POTASSIUM: 4 mmol/L (ref 3.5–5.1)
SODIUM: 141 mmol/L (ref 136–145)
TOTAL PROTEIN: 6.8 g/dL (ref 6.4–8.3)
Total Bilirubin: 0.6 mg/dL (ref 0.2–1.2)

## 2017-11-09 LAB — CBC WITH DIFFERENTIAL/PLATELET
BASOS ABS: 0 10*3/uL (ref 0.0–0.1)
Basophils Relative: 0 %
EOS ABS: 0.2 10*3/uL (ref 0.0–0.5)
EOS PCT: 4 %
HCT: 42.4 % (ref 38.4–49.9)
Hemoglobin: 14.1 g/dL (ref 13.0–17.1)
LYMPHS PCT: 32 %
Lymphs Abs: 1.7 10*3/uL (ref 0.9–3.3)
MCH: 30.1 pg (ref 27.2–33.4)
MCHC: 33.3 g/dL (ref 32.0–36.0)
MCV: 90.6 fL (ref 79.3–98.0)
MONO ABS: 0.5 10*3/uL (ref 0.1–0.9)
Monocytes Relative: 9 %
Neutro Abs: 2.9 10*3/uL (ref 1.5–6.5)
Neutrophils Relative %: 55 %
Platelets: 228 10*3/uL (ref 140–400)
RBC: 4.68 MIL/uL (ref 4.20–5.82)
RDW: 13.4 % (ref 11.0–14.6)
WBC: 5.3 10*3/uL (ref 4.0–10.3)

## 2017-11-09 LAB — LACTATE DEHYDROGENASE: LDH: 148 U/L (ref 125–245)

## 2017-11-09 NOTE — Assessment & Plan Note (Signed)
His blood pressure is high today could be due to anxiety I reassured the patient We will monitor his blood pressure carefully in his next visit I recommend close follow-up with primary care doctor

## 2017-11-09 NOTE — Progress Notes (Signed)
[place] OFFICE PROGRESS NOTE  Patient Care Team: Leighton Ruff, MD as PCP - General (Family Medicine) Melissa Montane, MD as Consulting Physician (Otolaryngology) Heath Lark, MD as Consulting Physician (Hematology and Oncology)  ASSESSMENT & PLAN:  History of B-cell lymphoma He complained of intermittent abdominal discomfort but has since resolved The patient never had screening colonoscopy and had refused GI workup int he past His blood work is normal The patient is not enthusiastic to pursue imaging study I recommend the patient to call me if his symptoms recur or if he have recurrent night sweats Otherwise I see him once a year  Elevated BP without diagnosis of hypertension His blood pressure is high today could be due to anxiety I reassured the patient We will monitor his blood pressure carefully in his next visit I recommend close follow-up with primary care doctor   No orders of the defined types were placed in this encounter.   INTERVAL HISTORY: Please see below for problem oriented charting. He is evaluated sooner than his appointment due to concerns for cancer recurrence 2 weeks ago, he had sudden onset of abdominal discomfort in the retroperitoneum region along with night sweats It subsequently resolved conservatively without anything done He denies changes in his appetite or bowel habits He did not take any over-the-counter analgesics at that time He denies recent infection No new lymphadenopathy  SUMMARY OF ONCOLOGIC HISTORY: Oncology History   Follicular lymphoma grade 3a   Staging form: Lymphoid Neoplasms, AJCC 6th Edition     Clinical stage from 12/27/2014: Stage III - Signed by Heath Lark, MD on 12/27/2014        History of B-cell lymphoma   10/31/2014 Pathology Results    Accession: [id number] showed atypical lymphoid population      11/28/2014 Pathology Results    Accession: [id number] Biopsy showed high grade follicular  lymphoma      11/28/2014 Surgery    He had open biopsy of left neck mass      12/22/2014 Imaging    E[facility]o showed normal EF      12/22/2014 Imaging    PET scan showed stage III disease      12/22/2014 Bone Marrow Biopsy    Bone marrow biopsy was negative      01/05/2015 - 05/10/2015 Chemotherapy    He received R- CHOP chemotherapy x 6 cycles      02/14/2015 Imaging    PEt CT scan showed near complete response to Rx      04/19/2015 Adverse Reaction     he has peripheral neuropathy. Dose of vincristine is reduced      06/06/2015 Imaging    PET CT showed persistent lymphadenopathy but not hypermetabolic      1/60/1093 PET scan    1. Stable exam. No evidence for hypermetabolic disease in the neck, chest, abdomen, or pelvis on today's study. 2. Interval removal of Port-A-Cath with FDG accumulation at the site of the port, compatible with granulation/healing.       REVIEW OF SYSTEMS:   Constitutional: Denies fevers, chills or abnormal weight loss Eyes: Denies blurriness of vision Ears, nose, mouth, throat, and face: Denies mucositis or sore throat Respiratory: Denies cough, dyspnea or wheezes Cardiovascular: Denies palpitation, chest discomfort or lower extremity swelling Gastrointestinal:  Denies nausea, heartburn or change in bowel habits Skin: Denies abnormal skin rashes Lymphatics: Denies new lymphadenopathy or easy bruising Neurological:Denies numbness, tingling or new weaknesses Behavioral/Psych: Mood is stable, no new changes  All other  systems were reviewed with the patient and are negative.  I have reviewed the past medical history, past surgical history, social history and family history with the patient and they are unchanged from previous note.  ALLERGIES:  is allergic to allopurinol.  MEDICATIONS:  Current Outpatient Medications  Medication Sig Dispense Refill  . cholecalciferol (VITAMIN D) 1000 units tablet Take 1,000 Units by mouth daily.     No current  facility-administered medications for this visit.    Facility-Administered Medications Ordered in Other Visits  Medication Dose Route Frequency Provider Last Rate Last Dose  . sodium chloride 0.9 % injection 10 mL  10 mL Intravenous PRN Alvy Bimler, Brigit Doke, MD   10 mL at 01/26/15 1346  . sodium chloride 0.9 % injection 10 mL  10 mL Intravenous PRN Alvy Bimler, Jovie Swanner, MD        PHYSICAL EXAMINATION: ECOG PERFORMANCE STATUS: 0 - Asymptomatic  Vitals:   11/09/17 0909  BP: (!) 148/79  Pulse: 61  Resp: 18  Temp: 97.6 F (36.4 C)  SpO2: 100%   Filed Weights   11/09/17 0909  Weight: 155 lb (70.3 kg)    GENERAL:alert, no distress and comfortable SKIN: skin color, texture, turgor are normal, no rashes or significant lesions EYES: normal, Conjunctiva are pink and non-injected, sclera clear OROPHARYNX:no exudate, no erythema and lips, buccal mucosa, and tongue normal  NECK: supple, thyroid normal size, non-tender, without nodularity LYMPH:  no palpable lymphadenopathy in the cervical, axillary or inguinal LUNGS: clear to auscultation and percussion with normal breathing effort HEART: regular rate & rhythm and no murmurs and no lower extremity edema ABDOMEN:abdomen soft, non-tender and normal bowel sounds Musculoskeletal:no cyanosis of digits and no clubbing  NEURO: alert & oriented x 3 with fluent speech, no focal motor/sensory deficits  LABORATORY DATA:  I have reviewed the data as listed    Component Value Date/Time   NA 141 11/09/2017 0832   NA 139 03/06/2017 0920   K 4.0 11/09/2017 0832   K 4.2 03/06/2017 0920   CL 105 11/09/2017 0832   CO2 28 11/09/2017 0832   CO2 27 03/06/2017 0920   GLUCOSE 86 11/09/2017 0832   GLUCOSE 85 03/06/2017 0920   BUN 15 11/09/2017 0832   BUN 18.3 03/06/2017 0920   CREATININE 0.88 11/09/2017 0832   CREATININE 0.9 03/06/2017 0920   CALCIUM 9.3 11/09/2017 0832   CALCIUM 9.8 03/06/2017 0920   PROT 6.8 11/09/2017 0832   PROT 7.0 03/06/2017 0920   ALBUMIN  4.4 11/09/2017 0832   ALBUMIN 4.5 03/06/2017 0920   AST 16 11/09/2017 0832   AST 18 03/06/2017 0920   ALT 17 11/09/2017 0832   ALT 13 03/06/2017 0920   ALKPHOS 85 11/09/2017 0832   ALKPHOS 89 03/06/2017 0920   BILITOT 0.6 11/09/2017 0832   BILITOT 0.80 03/06/2017 0920   GFRNONAA >60 11/09/2017 0832   GFRAA >60 11/09/2017 0832    No results found for: SPEP, UPEP  Lab Results  Component Value Date   WBC 5.3 11/09/2017   NEUTROABS 2.9 11/09/2017   HGB 14.1 11/09/2017   HCT 42.4 11/09/2017   MCV 90.6 11/09/2017   PLT 228 11/09/2017      Chemistry      Component Value Date/Time   NA 141 11/09/2017 0832   NA 139 03/06/2017 0920   K 4.0 11/09/2017 0832   K 4.2 03/06/2017 0920   CL 105 11/09/2017 0832   CO2 28 11/09/2017 0832   CO2 27 03/06/2017 0920  BUN 15 11/09/2017 0832   BUN 18.3 03/06/2017 0920   CREATININE 0.88 11/09/2017 0832   CREATININE 0.9 03/06/2017 0920      Component Value Date/Time   CALCIUM 9.3 11/09/2017 0832   CALCIUM 9.8 03/06/2017 0920   ALKPHOS 85 11/09/2017 0832   ALKPHOS 89 03/06/2017 0920   AST 16 11/09/2017 0832   AST 18 03/06/2017 0920   ALT 17 11/09/2017 0832   ALT 13 03/06/2017 0920   BILITOT 0.6 11/09/2017 0832   BILITOT 0.80 03/06/2017 0920       All questions were answered. The patient knows to call the clinic with any problems, questions or concerns. No barriers to learning was detected.  I spent 10 minutes counseling the patient face to face. The total time spent in the appointment was 15 minutes and more than 50% was on counseling and review of test results  Heath Lark, MD 11/09/2017 10:04 AM

## 2017-11-09 NOTE — Telephone Encounter (Signed)
Patient declined AVs and calendar of upcoming appointments. Will receive updated copy of schedule on mychart.

## 2017-11-09 NOTE — Assessment & Plan Note (Signed)
He complained of intermittent abdominal discomfort but has since resolved The patient never had screening colonoscopy and had refused GI workup int he past His blood work is normal The patient is not enthusiastic to pursue imaging study I recommend the patient to call me if his symptoms recur or if he have recurrent night sweats Otherwise I see him once a year

## 2018-02-01 ENCOUNTER — Other Ambulatory Visit: Payer: BLUE CROSS/BLUE SHIELD

## 2018-02-01 ENCOUNTER — Ambulatory Visit: Payer: BLUE CROSS/BLUE SHIELD | Admitting: Hematology and Oncology

## 2018-06-21 IMAGING — CT NM PET TUM IMG RESTAG (PS) SKULL BASE T - THIGH
7 series · 25 of 25 positions shown · non-contrast
Comparison: None.

CLINICAL DATA: Subsequent treatment strategy for diffuse large
B-cell lymphoma.

EXAM:
NUCLEAR MEDICINE PET SKULL BASE TO THIGH
TECHNIQUE: 7.6 mCi F-18 FDG was injected intravenously. Full-ring PET imaging
was performed from the skull base to thigh after the radiotracer. CT
data was obtained and used for attenuation correction and anatomic
localization.
FASTING BLOOD GLUCOSE:  Value: 87 mg/dl

[Series 3: pet sk_thigh ac · axial · 5.0mm · 4.07mm/px · z∈[-973,-101]mm · 6 of 219 slices shown]
[im 1/219]
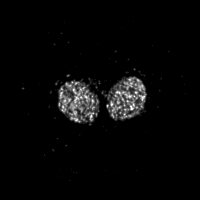
[im 44/219]
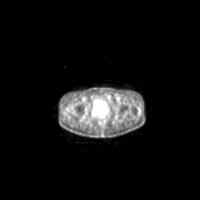
[im 88/219]
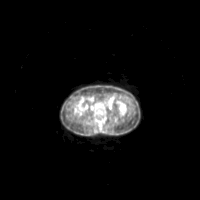
[im 131/219]
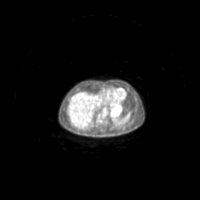
[im 175/219]
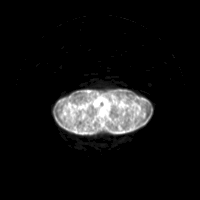
[im 219/219]
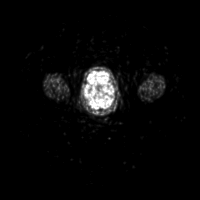

[Series 4: ct sk_thigh 5.0 b31f · axial · 5.0mm · 0.98mm/px · z∈[-973,-101]mm · 5 of 219 slices shown]
[im 1/219]
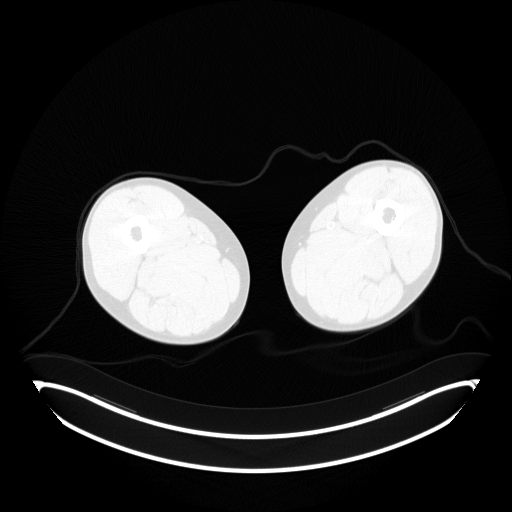
[im 55/219]
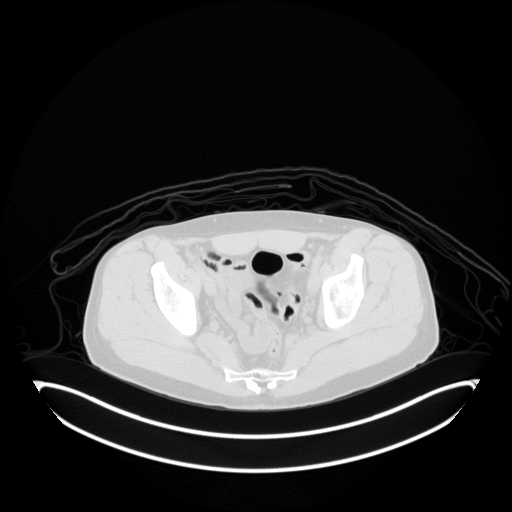
[im 110/219]
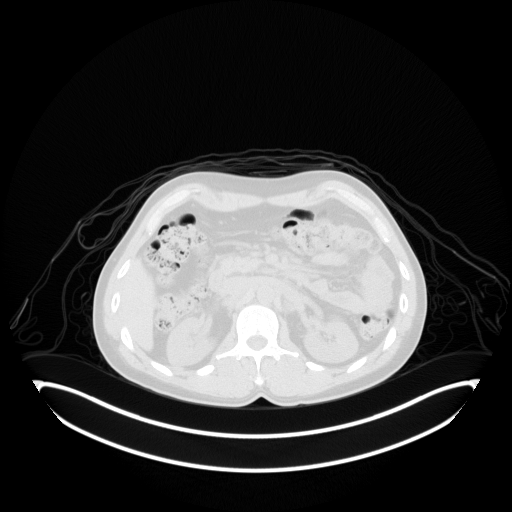
[im 164/219]
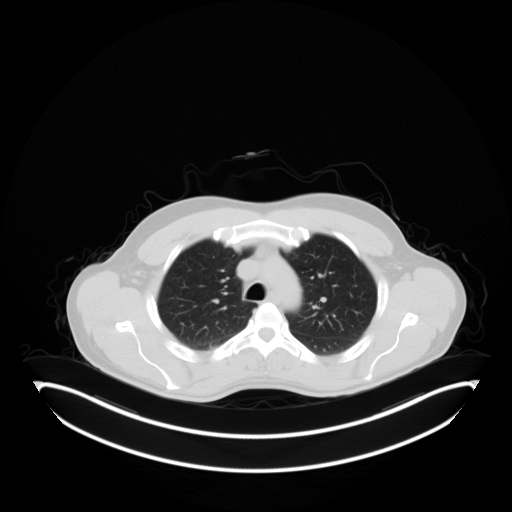
[im 219/219  brain]
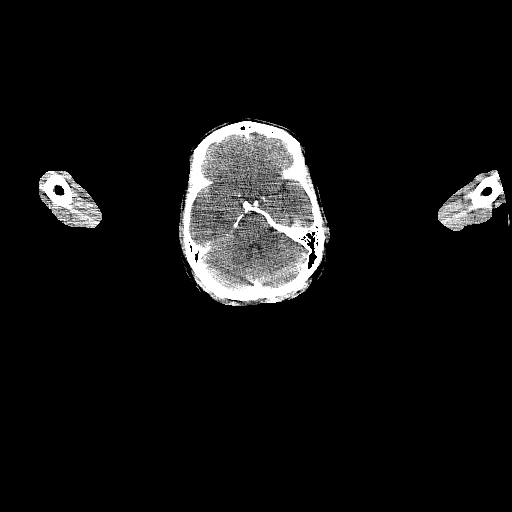

[Series 5: pet sk_thigh nac · axial · 5.0mm · 4.07mm/px · z∈[-973,-101]mm · 5 of 219 slices shown]
[im 1/219]
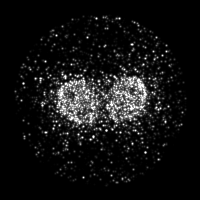
[im 55/219]
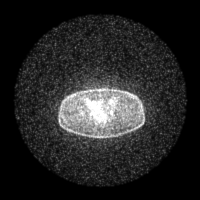
[im 110/219]
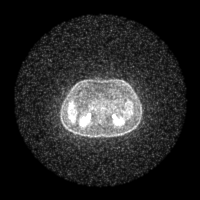
[im 164/219]
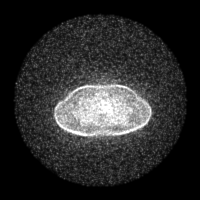
[im 219/219]
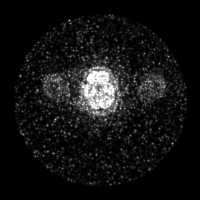

[Series 8: ct sk_thigh 5.0 b70f lung_bone · axial · 5.0mm · 0.66mm/px · 1 of 54 slices shown]
[im 1/54  bone]
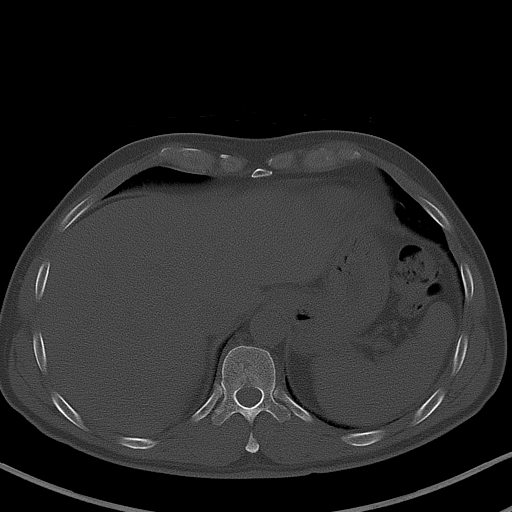

[Series 603: range-ct sk_thigh 5.0 (id)<alpha range> · 2 of 86 slices shown (1 of 2)]
[im 1/86]
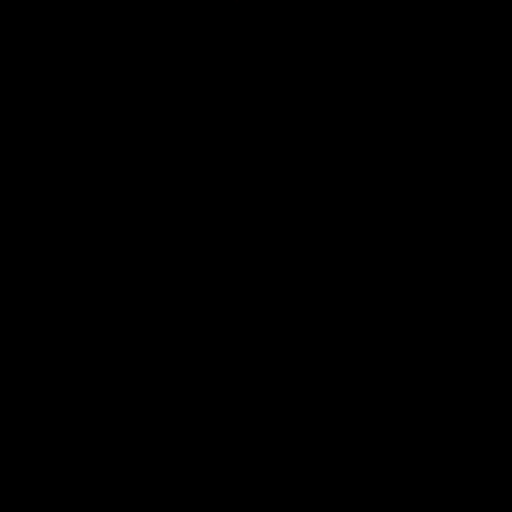
[im 86/86]
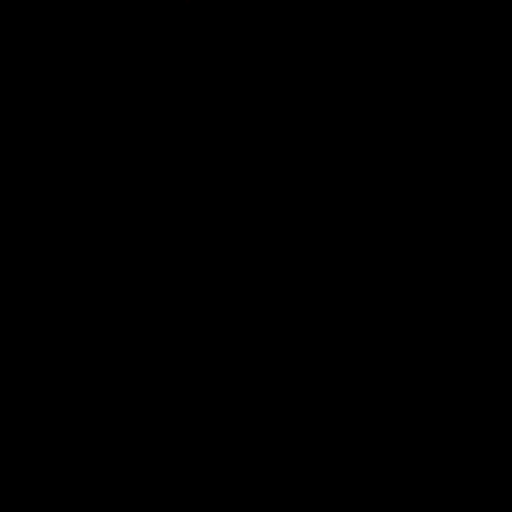

[Series 604: mip range · coronal · 1.81mm/px · 1 of 32 slices shown]
[im 1/32]
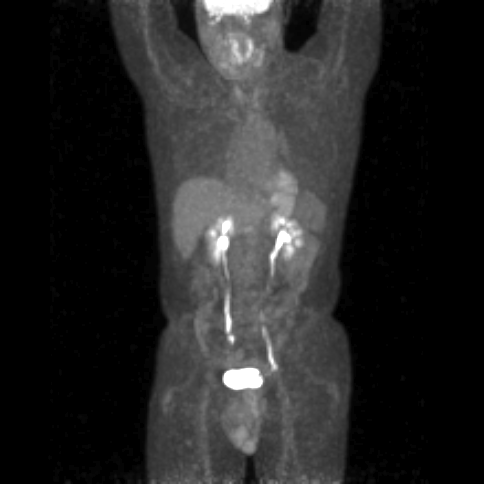

[Series 605: range-ct sk_thigh 5.0 (id)<alpha range> · 5 of 205 slices shown (2 of 2)]
[im 1/205]
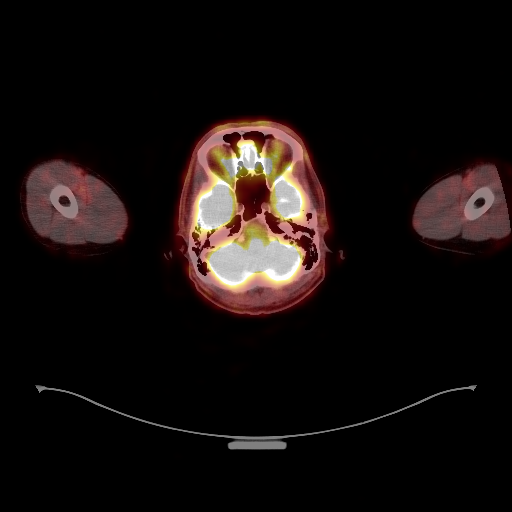
[im 52/205]
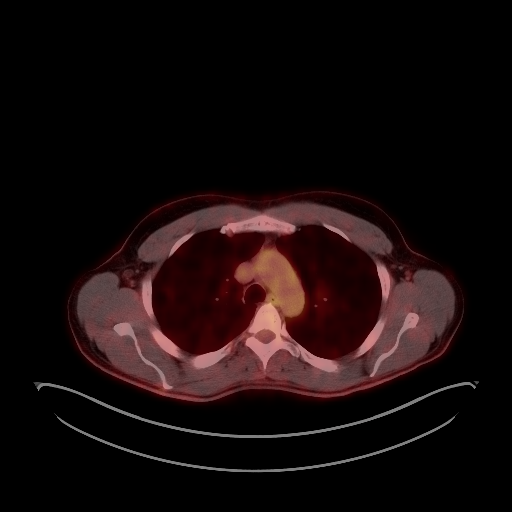
[im 103/205]
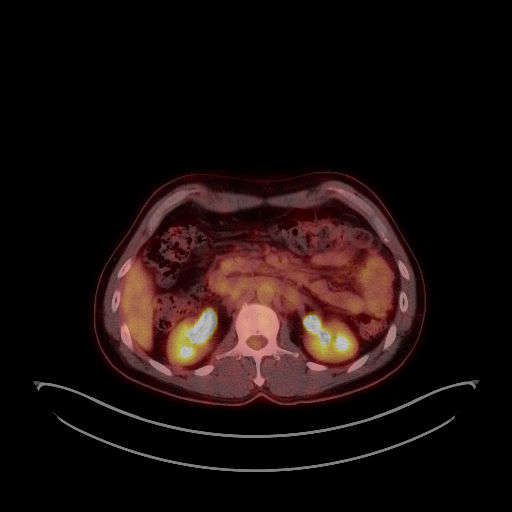
[im 154/205]
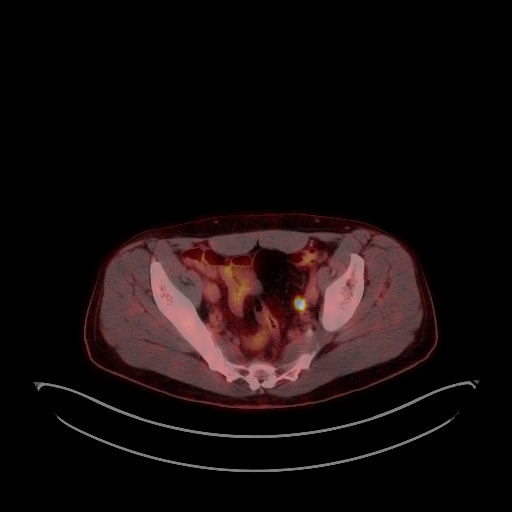
[im 205/205]
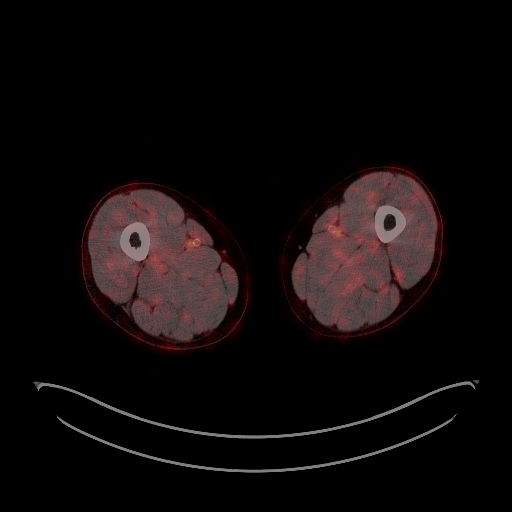

[25 of 25 positions shown; findings below may reference images not displayed]

FINDINGS: NECK: Tiny left supraclavicular lymph node identified on prior
imaging studies is again noted and is unchanged. As before, there is
no increased FDG uptake in this tiny lymph node above background
soft tissue levels.

CHEST: No hypermetabolic mediastinal or hilar nodes. No suspicious
pulmonary nodules on the CT scan.

Trace uptake identified in the medial aspect the anterior right
chest wall, at the location of prior port. This is compatible with
granulation from healing of the port site.

ABDOMEN/PELVIS: The retroperitoneal lymph nodes and retroperitoneal
stranding seen on the prior study are stable in the interval. The
index lymph node adjacent to the SMA measured previously 11 mm is 8
mm short axis today. No evidence for hypermetabolic FDG
accumulation, and features remain compatible with treated disease.

Prior cholecystectomy.

SKELETON: No focal hypermetabolic activity to suggest skeletal
metastasis.
IMPRESSION: 1. Stable exam. No evidence for hypermetabolic disease in the neck,
chest, abdomen, or pelvis on today's study.
2. Interval removal of Port-A-Cath with FDG accumulation at the site
of the port, compatible with granulation/healing.

## 2018-11-01 ENCOUNTER — Other Ambulatory Visit: Payer: Self-pay | Admitting: Hematology and Oncology

## 2018-11-01 DIAGNOSIS — Z8572 Personal history of non-Hodgkin lymphomas: Secondary | ICD-10-CM

## 2018-11-08 ENCOUNTER — Inpatient Hospital Stay: Payer: BLUE CROSS/BLUE SHIELD | Attending: Family Medicine

## 2018-11-08 ENCOUNTER — Inpatient Hospital Stay: Payer: BLUE CROSS/BLUE SHIELD | Admitting: Hematology and Oncology

## 2018-11-09 ENCOUNTER — Telehealth: Payer: Self-pay | Admitting: Hematology and Oncology

## 2018-11-09 NOTE — Telephone Encounter (Signed)
Scheduled appt per sch msg. Called and left msg for patient. Mailed printout.

## 2018-11-12 ENCOUNTER — Other Ambulatory Visit: Payer: Self-pay

## 2018-12-02 ENCOUNTER — Telehealth: Payer: Self-pay | Admitting: Hematology and Oncology

## 2018-12-02 NOTE — Telephone Encounter (Signed)
Returned patient's phone call regarding cancelling an appointment, patient has different insurance now and would prefer these appointments to be cancelled.

## 2018-12-06 ENCOUNTER — Ambulatory Visit: Payer: BLUE CROSS/BLUE SHIELD | Admitting: Hematology and Oncology

## 2018-12-06 ENCOUNTER — Other Ambulatory Visit: Payer: BLUE CROSS/BLUE SHIELD

## 2020-08-22 ENCOUNTER — Encounter: Payer: Self-pay | Admitting: Internal Medicine

## 2020-08-22 ENCOUNTER — Other Ambulatory Visit: Payer: Self-pay

## 2020-08-22 ENCOUNTER — Ambulatory Visit: Payer: 59 | Admitting: Internal Medicine

## 2020-08-22 VITALS — BP 110/80 | HR 91 | Temp 98.2°F | Ht 67.0 in | Wt 152.7 lb

## 2020-08-22 DIAGNOSIS — R030 Elevated blood-pressure reading, without diagnosis of hypertension: Secondary | ICD-10-CM | POA: Diagnosis not present

## 2020-08-22 DIAGNOSIS — Z8572 Personal history of non-Hodgkin lymphomas: Secondary | ICD-10-CM

## 2020-08-22 DIAGNOSIS — Z23 Encounter for immunization: Secondary | ICD-10-CM

## 2020-08-22 DIAGNOSIS — R351 Nocturia: Secondary | ICD-10-CM

## 2020-08-22 DIAGNOSIS — N401 Enlarged prostate with lower urinary tract symptoms: Secondary | ICD-10-CM | POA: Diagnosis not present

## 2020-08-22 DIAGNOSIS — Z1211 Encounter for screening for malignant neoplasm of colon: Secondary | ICD-10-CM

## 2020-08-22 NOTE — Patient Instructions (Signed)
-  Nice seeing you today!!  -tetanus and first shingles vaccines today.  -Schedule follow up in 3 months for your physical. Please come in fasting that day.

## 2020-08-22 NOTE — Addendum Note (Signed)
Addended by: Westley Hummer B on: 08/22/2020 03:00 PM   Modules accepted: Orders

## 2020-08-22 NOTE — Progress Notes (Signed)
New Patient Office Visit     This visit occurred during the SARS-CoV-2 public health emergency.  Safety protocols were in place, including screening questions prior to the visit, additional usage of staff PPE, and extensive cleaning of exam room while observing appropriate contact time as indicated for disinfecting solutions.    CC/Reason for Visit: Establish care, discuss chronic conditions Previous PCP: Leighton Ruff Last Visit: April 2021  HPI: Eusevio Schriver is a 65 y.o. male who is coming in today for the above mentioned reasons. Past Medical History is significant for: B-cell lymphoma who has been followed in the past by the [facility] lung [place].  He also has a history of BPH with nocturia.  He is requesting referrals back to oncology and to urology.  He does not smoke, he does not drink alcohol, he does not take any chronic medications.  He has no known drug allergies.  His mother is deceased from breast cancer.  He has had 2 COVID vaccines, he is overdue for flu, Tdap and shingles vaccines.  Declines flu today but will take shingles and Tdap.  Other than this no acute complaints.   Past Medical/Surgical History: Past Medical History:  Diagnosis Date  . Arthritis    both shoulders   . Cancer ([place])    lymphoma  . Chronic dental infection 04/19/2015  . Grade 3a follicular lymphoma of lymph nodes of multiple regions ([place]) 12/15/2014  . Neck mass    left  . Tuberculosis    treatment as a young person     Past Surgical History:  Procedure Laterality Date  . CHOLECYSTECTOMY    . MASS BIOPSY Left 11/28/2014   Procedure: OPEN  BIOPSY LEFT NECK MASS;  Surgeon: Melissa Montane, MD;  Location: [facility];  Service: ENT;  Laterality: Left;  Marland Kitchen VASECTOMY    . VASECTOMY REVERSAL  2004    Social History:  reports that he has never smoked. He has never used smokeless tobacco. He reports current alcohol use. He reports that he does not use  drugs.  Allergies: Allergies  Allergen Reactions  . Allopurinol Hives    Family History:  Family History  Problem Relation Age of Onset  . Breast cancer Mother 4       breast ca    No current outpatient medications on file. No current facility-administered medications for this visit.  Facility-Administered Medications Ordered in Other Visits:  .  sodium chloride 0.9 % injection 10 mL, 10 mL, Intravenous, PRN, Alvy Bimler, Ni, MD, 10 mL at 01/26/15 1346 .  sodium chloride 0.9 % injection 10 mL, 10 mL, Intravenous, PRN, Alvy Bimler, Ni, MD  Review of Systems:  Constitutional: Denies fever, chills, diaphoresis, appetite change and fatigue.  HEENT: Denies photophobia, eye pain, redness, hearing loss, ear pain, congestion, sore throat, rhinorrhea, sneezing, mouth sores, trouble swallowing, neck pain, neck stiffness and tinnitus.   Respiratory: Denies SOB, DOE, cough, chest tightness,  and wheezing.   Cardiovascular: Denies chest pain, palpitations and leg swelling.  Gastrointestinal: Denies nausea, vomiting, abdominal pain, diarrhea, constipation, blood in stool and abdominal distention.  Genitourinary: Denies dysuria, urgency, frequency, hematuria, flank pain and difficulty urinating.  Endocrine: Denies: hot or cold intolerance, sweats, changes in hair or nails, polyuria, polydipsia. Musculoskeletal: Denies myalgias, back pain, joint swelling, arthralgias and gait problem.  Skin: Denies pallor, rash and wound.  Neurological: Denies dizziness, seizures, syncope, weakness, light-headedness, numbness and headaches.  Hematological: Denies adenopathy. Easy bruising, personal or family bleeding history  Psychiatric/Behavioral: Denies suicidal ideation, mood changes, confusion, nervousness, sleep disturbance and agitation    Physical Exam: Vitals:   08/22/20 1312  BP: 110/80  Pulse: 91  Temp: 98.2 F (36.8 C)  TempSrc: Oral  SpO2: 98%  Weight: 152 lb 11.2 oz (69.3 kg)  Height: 5\' 7"   (1.702 m)   Body mass index is 23.92 kg/m.  Constitutional: NAD, calm, comfortable Eyes: PERRL, lids and conjunctivae normal ENMT: Mucous membranes are moist. Respiratory: clear to auscultation bilaterally, no wheezing, no crackles. Normal respiratory effort. No accessory muscle use.  Cardiovascular: Regular rate and rhythm, no murmurs / rubs / gallops. No extremity edema.  Neurologic: Grossly intact and nonfocal Psychiatric: Normal judgment and insight. Alert and oriented x 3. Normal mood.    Impression and Plan:  History of B-cell lymphoma -Referral back to urology today.  Elevated BP without diagnosis of hypertension -Blood pressure is well controlled.  Need for Tdap vaccination -Tdap administered today.  Need for shingles vaccine -For shingles vaccine administered today.  Benign prostatic hyperplasia with nocturia -Referral to urology per patient request.  Screening for malignant neoplasm of colon  - Plan: Ambulatory referral to Gastroenterology    Patient Instructions  -Nice seeing you today!!  -tetanus and first shingles vaccines today.  -Schedule follow up in 3 months for your physical. Please come in fasting that day.     Lelon Frohlich, MD [facility]

## 2020-09-05 ENCOUNTER — Encounter: Payer: Self-pay | Admitting: Gastroenterology

## 2020-10-10 ENCOUNTER — Encounter: Payer: Self-pay | Admitting: Internal Medicine

## 2020-10-10 NOTE — Telephone Encounter (Signed)
error 

## 2020-11-06 ENCOUNTER — Ambulatory Visit (AMBULATORY_SURGERY_CENTER): Payer: Self-pay | Admitting: *Deleted

## 2020-11-06 ENCOUNTER — Other Ambulatory Visit: Payer: Self-pay

## 2020-11-06 VITALS — Ht 67.0 in | Wt 153.0 lb

## 2020-11-06 DIAGNOSIS — Z1211 Encounter for screening for malignant neoplasm of colon: Secondary | ICD-10-CM

## 2020-11-06 MED ORDER — NA SULFATE-K SULFATE-MG SULF 17.5-3.13-1.6 GM/177ML PO SOLN: ORAL | 0 refills | Status: DC

## 2020-11-06 NOTE — Progress Notes (Signed)

## 2020-11-14 ENCOUNTER — Other Ambulatory Visit: Payer: Self-pay

## 2020-11-15 ENCOUNTER — Encounter: Payer: Self-pay | Admitting: Internal Medicine

## 2020-11-15 ENCOUNTER — Ambulatory Visit (INDEPENDENT_AMBULATORY_CARE_PROVIDER_SITE_OTHER): Payer: 59 | Admitting: Internal Medicine

## 2020-11-15 ENCOUNTER — Other Ambulatory Visit: Payer: Self-pay | Admitting: Internal Medicine

## 2020-11-15 VITALS — BP 136/86 | HR 69 | Temp 97.7°F | Ht 67.0 in | Wt 149.2 lb

## 2020-11-15 DIAGNOSIS — R351 Nocturia: Secondary | ICD-10-CM | POA: Diagnosis not present

## 2020-11-15 DIAGNOSIS — Z23 Encounter for immunization: Secondary | ICD-10-CM | POA: Diagnosis not present

## 2020-11-15 DIAGNOSIS — Z0000 Encounter for general adult medical examination without abnormal findings: Secondary | ICD-10-CM

## 2020-11-15 DIAGNOSIS — Z8572 Personal history of non-Hodgkin lymphomas: Secondary | ICD-10-CM | POA: Diagnosis not present

## 2020-11-15 DIAGNOSIS — R5382 Chronic fatigue, unspecified: Secondary | ICD-10-CM | POA: Diagnosis not present

## 2020-11-15 DIAGNOSIS — E559 Vitamin D deficiency, unspecified: Secondary | ICD-10-CM | POA: Diagnosis not present

## 2020-11-15 DIAGNOSIS — N401 Enlarged prostate with lower urinary tract symptoms: Secondary | ICD-10-CM | POA: Diagnosis not present

## 2020-11-15 LAB — CBC WITH DIFFERENTIAL/PLATELET
Basophils Absolute: 0 10*3/uL (ref 0.0–0.1)
Basophils Relative: 0.6 % (ref 0.0–3.0)
Eosinophils Absolute: 0.1 10*3/uL (ref 0.0–0.7)
Eosinophils Relative: 2.1 % (ref 0.0–5.0)
HCT: 43.3 % (ref 39.0–52.0)
Hemoglobin: 14.7 g/dL (ref 13.0–17.0)
Lymphocytes Relative: 33.3 % (ref 12.0–46.0)
Lymphs Abs: 1.6 10*3/uL (ref 0.7–4.0)
MCHC: 33.9 g/dL (ref 30.0–36.0)
MCV: 90.2 fl (ref 78.0–100.0)
Monocytes Absolute: 0.4 10*3/uL (ref 0.1–1.0)
Monocytes Relative: 9.1 % (ref 3.0–12.0)
Neutro Abs: 2.6 10*3/uL (ref 1.4–7.7)
Neutrophils Relative %: 54.9 % (ref 43.0–77.0)
Platelets: 231 10*3/uL (ref 150.0–400.0)
RBC: 4.8 Mil/uL (ref 4.22–5.81)
RDW: 13.7 % (ref 11.5–15.5)
WBC: 4.8 10*3/uL (ref 4.0–10.5)

## 2020-11-15 LAB — LIPID PANEL
Cholesterol: 161 mg/dL (ref 0–200)
HDL: 70.9 mg/dL (ref >39.00)
LDL Cholesterol: 81 mg/dL (ref 0–99)
NonHDL: 90.13
Total CHOL/HDL Ratio: 2
Triglycerides: 44 mg/dL (ref 0.0–149.0)
VLDL: 8.8 mg/dL (ref 0.0–40.0)

## 2020-11-15 LAB — TSH: TSH: 1.49 u[IU]/mL (ref 0.35–4.50)

## 2020-11-15 LAB — COMPREHENSIVE METABOLIC PANEL
ALT: 17 U/L (ref 0–53)
AST: 20 U/L (ref 0–37)
Albumin: 4.9 g/dL (ref 3.5–5.2)
Alkaline Phosphatase: 83 U/L (ref 39–117)
BUN: 17 mg/dL (ref 6–23)
CO2: 31 mEq/L (ref 19–32)
Calcium: 10.1 mg/dL (ref 8.4–10.5)
Chloride: 102 mEq/L (ref 96–112)
Creatinine, Ser: 0.84 mg/dL (ref 0.40–1.50)
GFR: 92.51 mL/min (ref >60.00)
Glucose, Bld: 86 mg/dL (ref 70–99)
Potassium: 4.2 mEq/L (ref 3.5–5.1)
Sodium: 140 mEq/L (ref 135–145)
Total Bilirubin: 1.4 mg/dL — ABNORMAL HIGH (ref 0.2–1.2)
Total Protein: 7 g/dL (ref 6.0–8.3)

## 2020-11-15 LAB — VITAMIN B12: Vitamin B-12: 242 pg/mL (ref 211–911)

## 2020-11-15 LAB — PSA: PSA: 3.2 ng/mL (ref 0.10–4.00)

## 2020-11-15 LAB — VITAMIN D 25 HYDROXY (VIT D DEFICIENCY, FRACTURES): VITD: 22.08 ng/mL — ABNORMAL LOW (ref 30.00–100.00)

## 2020-11-15 MED ORDER — VITAMIN D (ERGOCALCIFEROL) 1.25 MG (50000 UNIT) PO CAPS: 50000.0000 [IU] | ORAL_CAPSULE | ORAL | 0 refills | Status: AC

## 2020-11-15 NOTE — Progress Notes (Signed)
Established Patient Office Visit     This visit occurred during the SARS-CoV-2 public health emergency.  Safety protocols were in place, including screening questions prior to the visit, additional usage of staff PPE, and extensive cleaning of exam room while observing appropriate contact time as indicated for disinfecting solutions.    CC/Reason for Visit: Annual preventive exam  HPI: Travis Burton is a 65 y.o. male who is coming in today for the above mentioned reasons. Past Medical History is significant for: History of B-cell lymphoma, BPH with nocturia on alfuzosin followed by urology, prior history of vitamin D deficiency.  He has been feeling well and has no complaints.  He has routine dental care but no eye care due to lack of insurance.  He exercises by walking 1-1 and half hours every day.  He is scheduled for his first colonoscopy next week.  He is due for his second shingles vaccine and has only had 2 COVID vaccines.   Past Medical/Surgical History: Past Medical History:  Diagnosis Date  . Arthritis    both shoulders   . Cancer ([place])    lymphoma  . Chronic dental infection 04/19/2015  . Grade 3a follicular lymphoma of lymph nodes of multiple regions ([employer]) 12/15/2014  . Neck mass    left  . Transfusion of blood product refused for religious reason   . Tuberculosis 2009   treatment as a young person and treatment in 2009    Past Surgical History:  Procedure Laterality Date  . CHOLECYSTECTOMY    . MASS BIOPSY Left 11/28/2014   Procedure: OPEN  BIOPSY LEFT NECK MASS;  Surgeon: Melissa Montane, MD;  Location: [facility];  Service: ENT;  Laterality: Left;  Marland Kitchen VASECTOMY    . VASECTOMY REVERSAL  2004    Social History:  reports that he has never smoked. He has never used smokeless tobacco. He reports current alcohol use of about 2.0 standard drinks of alcohol per week. He reports that he does not use drugs.  Allergies: Allergies  Allergen  Reactions  . Allopurinol Hives    Family History:  Family History  Problem Relation Age of Onset  . Breast cancer Mother 15       breast ca  . Colon cancer Neg Hx   . Colon polyps Neg Hx   . Esophageal cancer Neg Hx   . Rectal cancer Neg Hx   . Stomach cancer Neg Hx      Current Outpatient Medications:  .  alfuzosin (UROXATRAL) 10 MG 24 hr tablet, Take 10 mg by mouth at bedtime., Disp: , Rfl:  .  Na Sulfate-K Sulfate-Mg Sulf 17.5-3.13-1.6 GM/177ML SOLN, Suprep (no substitutions)-TAKE AS DIRECTED. (Patient not taking: Reported on 11/15/2020), Disp: 354 mL, Rfl: 0 No current facility-administered medications for this visit.  Facility-Administered Medications Ordered in Other Visits:  .  sodium chloride 0.9 % injection 10 mL, 10 mL, Intravenous, PRN, Alvy Bimler, Ni, MD, 10 mL at 01/26/15 1346 .  sodium chloride 0.9 % injection 10 mL, 10 mL, Intravenous, PRN, Alvy Bimler, Ni, MD  Review of Systems:  Constitutional: Denies fever, chills, diaphoresis, appetite change and fatigue.  HEENT: Denies photophobia, eye pain, redness, hearing loss, ear pain, congestion, sore throat, rhinorrhea, sneezing, mouth sores, trouble swallowing, neck pain, neck stiffness and tinnitus.   Respiratory: Denies SOB, DOE, cough, chest tightness,  and wheezing.   Cardiovascular: Denies chest pain, palpitations and leg swelling.  Gastrointestinal: Denies nausea, vomiting, abdominal pain, diarrhea, constipation, blood  in stool and abdominal distention.  Genitourinary: Denies dysuria, urgency, frequency, hematuria, flank pain and difficulty urinating.  Endocrine: Denies: hot or cold intolerance, sweats, changes in hair or nails, polyuria, polydipsia. Musculoskeletal: Denies myalgias, back pain, joint swelling, arthralgias and gait problem.  Skin: Denies pallor, rash and wound.  Neurological: Denies dizziness, seizures, syncope, weakness, light-headedness, numbness and headaches.  Hematological: Denies adenopathy. Easy  bruising, personal or family bleeding history  Psychiatric/Behavioral: Denies suicidal ideation, mood changes, confusion, nervousness, sleep disturbance and agitation    Physical Exam: Vitals:   11/15/20 0956  BP: 136/86  Pulse: 69  Temp: 97.7 F (36.5 C)  TempSrc: Oral  SpO2: 97%  Weight: 149 lb 3.2 oz (67.7 kg)  Height: 5\' 7"  (1.702 m)    Body mass index is 23.37 kg/m.   Constitutional: NAD, calm, comfortable Eyes: PERRL, lids and conjunctivae normal ENMT: Mucous membranes are moist. Posterior pharynx clear of any exudate or lesions. Normal dentition. Tympanic membrane is pearly white, no erythema or bulging. Neck: normal, supple, no masses, no thyromegaly Respiratory: clear to auscultation bilaterally, no wheezing, no crackles. Normal respiratory effort. No accessory muscle use.  Cardiovascular: Regular rate and rhythm, no murmurs / rubs / gallops. No extremity edema. 2+ pedal pulses. No carotid bruits.  Abdomen: no tenderness, no masses palpated. No hepatosplenomegaly. Bowel sounds positive.  Musculoskeletal: no clubbing / cyanosis. No joint deformity upper and lower extremities. Good ROM, no contractures. Normal muscle tone.  Skin: no rashes, lesions, ulcers. No induration Neurologic: CN 2-12 grossly intact. Sensation intact, DTR normal. Strength 5/5 in all 4.  Psychiatric: Normal judgment and insight. Alert and oriented x 3. Normal mood.    Impression and Plan:  Encounter for preventive health examination -Advised routine eye and dental care. -He will get his second shingles vaccine today, he is also due for his third and fourth COVID vaccines but he declines despite counseling. -Screening labs today. -Healthy lifestyle discussed in detail. -PSA today. -He has his first screening colonoscopy scheduled for next week.  History of B-cell lymphoma  - Plan: CBC with Differential/Platelet, Comprehensive metabolic panel, Lipid panel -Has follow-up soon with  oncology.  Benign prostatic hyperplasia with nocturia -On an alpha-blocker, followed by urology.  Vitamin D deficiency  - Plan: VITAMIN D 25 Hydroxy (Vit-D Deficiency, Fractures)  Chronic fatigue  - Plan: TSH, Vitamin B12  Need for shingles vaccine -Second shingles vaccine today   Patient Instructions   -Nice seeing you today!!  -Lab work today; will notify you once results are available.  -Second shingles vaccine today.  -Consider getting your 3rd and 4th COVID vaccines.  -Schedule follow up in 1 year or sooner as needed.   Preventive Care 31-59 Years Old, Male Preventive care refers to lifestyle choices and visits with your health care provider that can promote health and wellness. This includes:  A yearly physical exam. This is also called an annual wellness visit.  Regular dental and eye exams.  Immunizations.  Screening for certain conditions.  Healthy lifestyle choices, such as: ? Eating a healthy diet. ? Getting regular exercise. ? Not using drugs or products that contain nicotine and tobacco. ? Limiting alcohol use. What can I expect for my preventive care visit? Physical exam Your health care provider will check your:  Height and weight. These may be used to calculate your BMI (body mass index). BMI is a measurement that tells if you are at a healthy weight.  Heart rate and blood pressure.  Body temperature.  Skin  for abnormal spots. Counseling Your health care provider may ask you questions about your:  Past medical problems.  Family's medical history.  Alcohol, tobacco, and drug use.  Emotional well-being.  Home life and relationship well-being.  Sexual activity.  Diet, exercise, and sleep habits.  Work and work Statistician.  Access to firearms. What immunizations do I need? Vaccines are usually given at various ages, according to a schedule. Your health care provider will recommend vaccines for you based on your age, medical  history, and lifestyle or other factors, such as travel or where you work.   What tests do I need? Blood tests  Lipid and cholesterol levels. These may be checked every 5 years, or more often if you are over 39 years old.  Hepatitis C test.  Hepatitis B test. Screening  Lung cancer screening. You may have this screening every year starting at age 74 if you have a 30-pack-year history of smoking and currently smoke or have quit within the past 15 years.  Prostate cancer screening. Recommendations will vary depending on your family history and other risks.  Genital exam to check for testicular cancer or hernias.  Colorectal cancer screening. ? All adults should have this screening starting at age 51 and continuing until age 8. ? Your health care provider may recommend screening at age 41 if you are at increased risk. ? You will have tests every 1-10 years, depending on your results and the type of screening test.  Diabetes screening. ? This is done by checking your blood sugar (glucose) after you have not eaten for a while (fasting). ? You may have this done every 1-3 years.  STD (sexually transmitted disease) testing, if you are at risk. Follow these instructions at home: Eating and drinking  Eat a diet that includes fresh fruits and vegetables, whole grains, lean protein, and low-fat dairy products.  Take vitamin and mineral supplements as recommended by your health care provider.  Do not drink alcohol if your health care provider tells you not to drink.  If you drink alcohol: ? Limit how much you have to 0-2 drinks a day. ? Be aware of how much alcohol is in your drink. In the [place] one drink equals one 12 oz bottle of beer (355 mL), one 5 oz glass of wine (148 mL), or one 1 oz glass of hard liquor (44 mL).   Lifestyle  Take daily care of your teeth and gums. Brush your teeth every morning and night with fluoride toothpaste. Floss one time each day.  Stay active.  Exercise for at least 30 minutes 5 or more days each week.  Do not use any products that contain nicotine or tobacco, such as cigarettes, e-cigarettes, and chewing tobacco. If you need help quitting, ask your health care provider.  Do not use drugs.  If you are sexually active, practice safe sex. Use a condom or other form of protection to prevent STIs (sexually transmitted infections).  If told by your health care provider, take low-dose aspirin daily starting at age 61.  Find healthy ways to cope with stress, such as: ? Meditation, yoga, or listening to music. ? Journaling. ? Talking to a trusted person. ? Spending time with friends and family. Safety  Always wear your seat belt while driving or riding in a vehicle.  Do not drive: ? If you have been drinking alcohol. Do not ride with someone who has been drinking. ? When you are tired or distracted. ? While texting.  Wear a helmet and other protective equipment during sports activities.  If you have firearms in your house, make sure you follow all gun safety procedures. What's next?  Go to your health care provider once a year for an annual wellness visit.  Ask your health care provider how often you should have your eyes and teeth checked.  Stay up to date on all vaccines. This information is not intended to replace advice given to you by your health care provider. Make sure you discuss any questions you have with your health care provider. Document Revised: 03/15/2019 Document Reviewed: 06/10/2018 Elsevier Patient Education  2021 [place], MD [facility]

## 2020-11-15 NOTE — Addendum Note (Signed)
Addended by: Alfredia Ferguson A on: 11/15/2020 10:36 AM   Modules accepted: Orders

## 2020-11-15 NOTE — Addendum Note (Signed)
Addended by: Janann Colonel on: 11/15/2020 10:33 AM   Modules accepted: Orders

## 2020-11-15 NOTE — Patient Instructions (Signed)
-Nice seeing you today!!  -Lab work today; will notify you once results are available.  -Second shingles vaccine today.  -Consider getting your 3rd and 4th COVID vaccines.  -Schedule follow up in 1 year or sooner as needed.   Preventive Care 64-64 Years Old, Male Preventive care refers to lifestyle choices and visits with your health care provider that can promote health and wellness. This includes:  A yearly physical exam. This is also called an annual wellness visit.  Regular dental and eye exams.  Immunizations.  Screening for certain conditions.  Healthy lifestyle choices, such as: ? Eating a healthy diet. ? Getting regular exercise. ? Not using drugs or products that contain nicotine and tobacco. ? Limiting alcohol use. What can I expect for my preventive care visit? Physical exam Your health care provider will check your:  Height and weight. These may be used to calculate your BMI (body mass index). BMI is a measurement that tells if you are at a healthy weight.  Heart rate and blood pressure.  Body temperature.  Skin for abnormal spots. Counseling Your health care provider may ask you questions about your:  Past medical problems.  Family's medical history.  Alcohol, tobacco, and drug use.  Emotional well-being.  Home life and relationship well-being.  Sexual activity.  Diet, exercise, and sleep habits.  Work and work Statistician.  Access to firearms. What immunizations do I need? Vaccines are usually given at various ages, according to a schedule. Your health care provider will recommend vaccines for you based on your age, medical history, and lifestyle or other factors, such as travel or where you work.   What tests do I need? Blood tests  Lipid and cholesterol levels. These may be checked every 5 years, or more often if you are over 64 years old.  Hepatitis C test.  Hepatitis B test. Screening  Lung cancer screening. You may have this  screening every year starting at age 64 if you have a 30-pack-year history of smoking and currently smoke or have quit within the past 15 years.  Prostate cancer screening. Recommendations will vary depending on your family history and other risks.  Genital exam to check for testicular cancer or hernias.  Colorectal cancer screening. ? All adults should have this screening starting at age 64 and continuing until age 65. ? Your health care provider may recommend screening at age 64 if you are at increased risk. ? You will have tests every 1-10 years, depending on your results and the type of screening test.  Diabetes screening. ? This is done by checking your blood sugar (glucose) after you have not eaten for a while (fasting). ? You may have this done every 1-3 years.  STD (sexually transmitted disease) testing, if you are at risk. Follow these instructions at home: Eating and drinking  Eat a diet that includes fresh fruits and vegetables, whole grains, lean protein, and low-fat dairy products.  Take vitamin and mineral supplements as recommended by your health care provider.  Do not drink alcohol if your health care provider tells you not to drink.  If you drink alcohol: ? Limit how much you have to 0-2 drinks a day. ? Be aware of how much alcohol is in your drink. In the [place] one drink equals one 12 oz bottle of beer (355 mL), one 5 oz glass of wine (148 mL), or one 1 oz glass of hard liquor (44 mL).   Lifestyle  Take daily care of your teeth  and gums. Brush your teeth every morning and night with fluoride toothpaste. Floss one time each day.  Stay active. Exercise for at least 30 minutes 5 or more days each week.  Do not use any products that contain nicotine or tobacco, such as cigarettes, e-cigarettes, and chewing tobacco. If you need help quitting, ask your health care provider.  Do not use drugs.  If you are sexually active, practice safe sex. Use a condom or other form  of protection to prevent STIs (sexually transmitted infections).  If told by your health care provider, take low-dose aspirin daily starting at age 64.  Find healthy ways to cope with stress, such as: ? Meditation, yoga, or listening to music. ? Journaling. ? Talking to a trusted person. ? Spending time with friends and family. Safety  Always wear your seat belt while driving or riding in a vehicle.  Do not drive: ? If you have been drinking alcohol. Do not ride with someone who has been drinking. ? When you are tired or distracted. ? While texting.  Wear a helmet and other protective equipment during sports activities.  If you have firearms in your house, make sure you follow all gun safety procedures. What's next?  Go to your health care provider once a year for an annual wellness visit.  Ask your health care provider how often you should have your eyes and teeth checked.  Stay up to date on all vaccines. This information is not intended to replace advice given to you by your health care provider. Make sure you discuss any questions you have with your health care provider. Document Revised: 03/15/2019 Document Reviewed: 06/10/2018 Elsevier Patient Education  2021 [employer].

## 2020-11-16 ENCOUNTER — Other Ambulatory Visit: Payer: Self-pay | Admitting: Internal Medicine

## 2020-11-16 DIAGNOSIS — E559 Vitamin D deficiency, unspecified: Secondary | ICD-10-CM

## 2020-11-20 ENCOUNTER — Other Ambulatory Visit: Payer: Self-pay

## 2020-11-20 ENCOUNTER — Ambulatory Visit (AMBULATORY_SURGERY_CENTER): Payer: 59 | Admitting: Gastroenterology

## 2020-11-20 ENCOUNTER — Encounter: Payer: Self-pay | Admitting: Gastroenterology

## 2020-11-20 VITALS — BP 124/72 | HR 66 | Temp 96.6°F | Resp 12 | Ht 67.0 in | Wt 153.0 lb

## 2020-11-20 DIAGNOSIS — D124 Benign neoplasm of descending colon: Secondary | ICD-10-CM

## 2020-11-20 DIAGNOSIS — Z1211 Encounter for screening for malignant neoplasm of colon: Secondary | ICD-10-CM | POA: Diagnosis not present

## 2020-11-20 DIAGNOSIS — D123 Benign neoplasm of transverse colon: Secondary | ICD-10-CM

## 2020-11-20 MED ORDER — SODIUM CHLORIDE 0.9 % IV SOLN: 500.0000 mL | Freq: Once | INTRAVENOUS | Status: DC

## 2020-11-20 NOTE — Op Note (Signed)
[facility] Patient Name: Travis Burton Procedure Date: 11/20/2020 9:28 AM MRN: [id number] Endoscopist: Milus Banister , MD Age: 65 Referring MD:  Date of Birth: 11-30-1955 Gender: Male Account #: [id number] Procedure:                Colonoscopy Indications:              Screening for colorectal malignant neoplasm Medicines:                Monitored Anesthesia Care Procedure:                Pre-Anesthesia Assessment:                           - Prior to the procedure, a History and Physical                            was performed, and patient medications and                            allergies were reviewed. The patient's tolerance of                            previous anesthesia was also reviewed. The risks                            and benefits of the procedure and the sedation                            options and risks were discussed with the patient.                            All questions were answered, and informed consent                            was obtained. Prior Anticoagulants: The patient has                            taken no previous anticoagulant or antiplatelet                            agents. ASA Grade Assessment: II - A patient with                            mild systemic disease. After reviewing the risks                            and benefits, the patient was deemed in                            satisfactory condition to undergo the procedure.                           After obtaining informed consent, the colonoscope  was passed under direct vision. Throughout the                            procedure, the patient's blood pressure, pulse, and                            oxygen saturations were monitored continuously. The                            Olympus CF-HQ190L [id number]) Colonoscope was                            introduced through the anus and advanced to the the                            cecum,  identified by appendiceal orifice and                            ileocecal valve. The colonoscopy was performed                            without difficulty. The patient tolerated the                            procedure well. The quality of the bowel                            preparation was good. The ileocecal valve,                            appendiceal orifice, and rectum were photographed. Scope In: 9:34:40 AM Scope Out: 9:46:22 AM Scope Withdrawal Time: 0 hours 9 minutes 15 seconds  Total Procedure Duration: 0 hours 11 minutes 42 seconds  Findings:                 Two sessile polyps were found in the descending                            colon and transverse colon. The polyps were 2 to 4                            mm in size. These polyps were removed with a cold                            snare. Resection and retrieval were complete.                           Multiple small and large-mouthed diverticula were                            found in the left colon.                           The exam was otherwise without abnormality on  direct and retroflexion views. Complications:            No immediate complications. Estimated blood loss:                            None. Estimated Blood Loss:     Estimated blood loss: none. Impression:               - Two 2 to 4 mm polyps in the descending colon and                            in the transverse colon, removed with a cold snare.                            Resected and retrieved.                           - Diverticulosis in the left colon.                           - The examination was otherwise normal on direct                            and retroflexion views. Recommendation:           - Patient has a contact number available for                            emergencies. The signs and symptoms of potential                            delayed complications were discussed with the                             patient. Return to normal activities tomorrow.                            Written discharge instructions were provided to the                            patient.                           - Resume previous diet.                           - Continue present medications.                           - Await pathology results. Milus Banister, MD 11/20/2020 9:48:24 AM This report has been signed electronically.

## 2020-11-20 NOTE — Patient Instructions (Addendum)
Please read handouts provided. Continue present medications. Await pathology results.    YOU HAD AN ENDOSCOPIC PROCEDURE TODAY AT [facility]:   Refer to the procedure report that was given to you for any specific questions about what was found during the examination.  If the procedure report does not answer your questions, please call your gastroenterologist to clarify.  If you requested that your care partner not be given the details of your procedure findings, then the procedure report has been included in a sealed envelope for you to review at your convenience later.  YOU SHOULD EXPECT: Some feelings of bloating in the abdomen. Passage of more gas than usual.  Walking can help get rid of the air that was put into your GI tract during the procedure and reduce the bloating. If you had a lower endoscopy (such as a colonoscopy or flexible sigmoidoscopy) you may notice spotting of blood in your stool or on the toilet paper. If you underwent a bowel prep for your procedure, you may not have a normal bowel movement for a few days.  Please Note:  You might notice some irritation and congestion in your nose or some drainage.  This is from the oxygen used during your procedure.  There is no need for concern and it should clear up in a day or so.  SYMPTOMS TO REPORT IMMEDIATELY:   Following lower endoscopy (colonoscopy or flexible sigmoidoscopy):  Excessive amounts of blood in the stool  Significant tenderness or worsening of abdominal pains  Swelling of the abdomen that is new, acute  Fever of 100F or higher  For urgent or emergent issues, a gastroenterologist can be reached at any hour by calling [phone]. Do not use MyChart messaging for urgent concerns.    DIET:  We do recommend a small meal at first, but then you may proceed to your regular diet.  Drink plenty of fluids but you should avoid alcoholic beverages for 24 hours.  ACTIVITY:  You should plan to take it easy  for the rest of today and you should NOT DRIVE or use heavy machinery until tomorrow (because of the sedation medicines used during the test).    FOLLOW UP: Our staff will call the number listed on your records 48-72 hours following your procedure to check on you and address any questions or concerns that you may have regarding the information given to you following your procedure. If we do not reach you, we will leave a message.  We will attempt to reach you two times.  During this call, we will ask if you have developed any symptoms of COVID 19. If you develop any symptoms (ie: fever, flu-like symptoms, shortness of breath, cough etc.) before then, please call [phone].  If you test positive for Covid 19 in the 2 weeks post procedure, please call and report this information to [place].    If any biopsies were taken you will be contacted by phone or by letter within the next 1-3 weeks.  Please call us at [phone] if you have not heard about the biopsies in 3 weeks.    SIGNATURES/CONFIDENTIALITY: You and/or your care partner have signed paperwork which will be entered into your electronic medical record.  These signatures attest to the fact that that the information above on your After Visit Summary has been reviewed and is understood.  Full responsibility of the confidentiality of this discharge information lies with you and/or your care-partner.[place] [facility]:   Lea el informe del procedimiento que se le entreg para cualquier pregunta especfica sobre lo que se Primary school teacher.  Si el informe del examen no responde a sus preguntas, por favor llame a su gastroenterlogo para aclararlo.  Si usted solicit que no se le den [employer] de lo que se Estate manager/land agent en su procedimiento al [employer] va a cuidar, entonces el informe del procedimiento se ha incluido en un sobre sellado para que usted lo revise despus cuando le sea ms  conveniente.   LO QUE PUEDE ESPERAR: Algunas sensaciones de hinchazn en el abdomen.  Puede tener ms gases de lo normal.  El caminar puede ayudarle a eliminar el aire que se le puso en el tracto gastrointestinal durante el procedimiento y reducir la hinchazn.  Si le hicieron una endoscopia inferior (como una colonoscopia o una sigmoidoscopia flexible), podra notar manchas de sangre en las heces fecales o en el papel higinico.  Si se someti a una preparacin intestinal para su procedimiento, es posible que no tenga una evacuacin intestinal normal durante [employer].   Tenga en cuenta:  Es posible que note un poco de irritacin y congestin en la nariz o algn drenaje.  Esto es debido al oxgeno [employer] durante su procedimiento.  No hay que preocuparse y esto debe desaparecer ms o Scientist, research (medical).   SNTOMAS PARA REPORTAR INMEDIATAMENTE:  Despus de una endoscopia inferior (colonoscopia o sigmoidoscopia flexible):  Cantidades excesivas de sangre en las heces fecales  Sensibilidad significativa o empeoramiento de los dolores abdominales   Hinchazn aguda del abdomen que antes no tena   Fiebre de 100F o ms   Despus de la endoscopia superior (EGD)  Vmitos de Biochemist, clinical o material como caf molido   Dolor en el pecho o dolor debajo de los omplatos que antes no tena   Dolor o dificultad persistente para tragar  Falta de aire que antes no tena   Fiebre de 100F o ms  Heces fecales negras y pegajosas   Para asuntos urgentes o de Freight forwarder, puede comunicarse con un gastroenterlogo a cualquier hora llamando al [phone].  DIETA:  Recomendamos una comida pequea al principio, pero luego puede continuar con su dieta normal.  Tome muchos lquidos, Teacher, adult education las bebidas alcohlicas durante 24 horas.    ACTIVIDAD:  Debe planear tomarse las cosas con calma por el resto del da y no debe CONDUCIR ni usar maquinaria pesada Programmer, applications (debido a los medicamentos de sedacin  utilizados durante el examen).     SEGUIMIENTO: Nuestro personal llamar al nmero que aparece en su historial al siguiente da hbil de su procedimiento para ver cmo se siente y para responder cualquier pregunta o inquietud que pueda tener con respecto a la informacin que se le dio despus del procedimiento. Si no podemos contactarle, le dejaremos un mensaje.  Sin embargo, si se siente bien y no tiene Paediatric nurse, no es necesario que nos devuelva la llamada.  Asumiremos que ha regresado a sus actividades diarias normales sin incidentes. Si se le tomaron algunas biopsias, le contactaremos por telfono o por carta en las prximas 3 semanas.  Si no ha sabido [employer] biopsias en el transcurso de 3 semanas, por favor llmenos al [phone].   FIRMAS/CONFIDENCIALIDAD: Usted y/o el acompaante que le cuide han firmado documentos que se ingresarn en su historial mdico electrnico.  Estas firmas atestiguan el hecho de que la informacin anterior USTED  TUVO UN PROCEDIMIENTO [facility]CPICO [facility]Y EN [facility]:   Lea el informe del procedimiento que se le entreg para cualquier pregunta especfica sobre lo que se Primary school teacher.  Si el informe del examen no responde a sus preguntas, por favor llame a su gastroenterlogo para aclararlo.  Si usted solicit que no se le den [employer] de lo que se Estate manager/land agent en su procedimiento al [employer] va a cuidar, entonces el informe del procedimiento se ha incluido en un sobre sellado para que usted lo revise despus cuando le sea ms conveniente.   LO QUE PUEDE ESPERAR: Algunas sensaciones de hinchazn en el abdomen.  Puede tener ms gases de lo normal.  El caminar puede ayudarle a eliminar el aire que se le puso en el tracto gastrointestinal durante el procedimiento y reducir la hinchazn.  Si le hicieron una endoscopia inferior (como una colonoscopia o una sigmoidoscopia flexible), podra notar manchas de sangre en las heces  fecales o en el papel higinico.  Si se someti a una preparacin intestinal para su procedimiento, es posible que no tenga una evacuacin intestinal normal durante [employer].   Tenga en cuenta:  Es posible que note un poco de irritacin y congestin en la nariz o algn drenaje.  Esto es debido al oxgeno [employer] durante su procedimiento.  No hay que preocuparse y esto debe desaparecer ms o Scientist, research (medical).   SNTOMAS PARA REPORTAR INMEDIATAMENTE:  Despus de una endoscopia inferior (colonoscopia o sigmoidoscopia flexible):  Cantidades excesivas de sangre en las heces fecales  Sensibilidad significativa o empeoramiento de los dolores abdominales   Hinchazn aguda del abdomen que antes no tena   Fiebre de 100F o ms   Despus de la endoscopia superior (EGD)  Vmitos de Biochemist, clinical o material como caf molido   Dolor en el pecho o dolor debajo de los omplatos que antes no tena   Dolor o dificultad persistente para tragar  Falta de aire que antes no tena   Fiebre de 100F o ms  Heces fecales negras y pegajosas   Para asuntos urgentes o de Freight forwarder, puede comunicarse con un gastroenterlogo a cualquier hora llamando al [phone].  DIETA:  Recomendamos una comida pequea al principio, pero luego puede continuar con su dieta normal.  Tome muchos lquidos, Teacher, adult education las bebidas alcohlicas durante 24 horas.    ACTIVIDAD:  Debe planear tomarse las cosas con calma por el resto del da y no debe CONDUCIR ni usar maquinaria pesada Programmer, applications (debido a los medicamentos de sedacin utilizados durante el examen).     SEGUIMIENTO: Nuestro personal llamar al nmero que aparece en su historial al siguiente da hbil de su procedimiento para ver cmo se siente y para responder cualquier pregunta o inquietud que pueda tener con respecto a la informacin que se le dio despus del procedimiento. Si no podemos contactarle, le dejaremos un mensaje.  Sin embargo, si se siente bien y no  tiene Paediatric nurse, no es necesario que nos devuelva la llamada.  Asumiremos que ha regresado a sus actividades diarias normales sin incidentes. Si se le tomaron algunas biopsias, le contactaremos por telfono o por carta en las prximas 3 semanas.  Si no ha sabido [employer] biopsias en el transcurso de 3 semanas, por favor llmenos al [phone].   FIRMAS/CONFIDENCIALIDAD: Usted y/o el acompaante que le cuide han firmado documentos que se ingresarn en su historial mdico electrnico.  Estas firmas atestiguan el hecho  de que la informacin anterior  

## 2020-11-20 NOTE — Progress Notes (Signed)
Pt's states no medical or surgical changes since previsit or office visit.  Check-in-jb  Vital signs-[place]

## 2020-11-20 NOTE — Progress Notes (Signed)
A and O x3. Report to RN. Tolerated MAC anesthesia well.

## 2020-11-22 ENCOUNTER — Telehealth: Payer: Self-pay | Admitting: *Deleted

## 2020-11-22 ENCOUNTER — Telehealth: Payer: Self-pay

## 2020-11-22 NOTE — Telephone Encounter (Addendum)
  Follow up Call-  Call back number 11/20/2020  Post procedure Call Back phone  # [phone]  Permission to leave phone message Yes  Some recent data might be hidden     Patient questions:  Message left to call us if necessary.

## 2020-11-22 NOTE — Telephone Encounter (Signed)
  Follow up Call-  Call back number 11/20/2020  Post procedure Call Back phone  # [phone]  Permission to leave phone message Yes  Some recent data might be hidden     Patient questions:  Do you have a fever, pain , or abdominal swelling? No. Pain Score  0 *  Have you tolerated food without any problems? Yes.    Have you been able to return to your normal activities? Yes.    Do you have any questions about your discharge instructions: Diet   No. Medications  No. Follow up visit  No.  Do you have questions or concerns about your Care? No.  Actions: * If pain score is 4 or above: No action needed, pain <4.  1. Have you developed a fever since your procedure? no  2.   Have you had an respiratory symptoms (SOB or cough) since your procedure? no  3.   Have you tested positive for COVID 19 since your procedure no  4.   Have you had any family members/close contacts diagnosed with the COVID 19 since your procedure?  no  If yes to any of these questions please route to Joylene John, RN and Joella Prince, RN

## 2020-11-22 NOTE — Telephone Encounter (Signed)
  Follow up Call-  Call back number 11/20/2020  Post procedure Call Back phone  # [phone]  Permission to leave phone message Yes  Some recent data might be hidden     Patient questions:  Duplicate.

## 2020-11-28 ENCOUNTER — Encounter: Payer: Self-pay | Admitting: Gastroenterology

## 2021-03-06 ENCOUNTER — Other Ambulatory Visit: Payer: Self-pay

## 2021-03-06 ENCOUNTER — Other Ambulatory Visit (INDEPENDENT_AMBULATORY_CARE_PROVIDER_SITE_OTHER): Payer: 59

## 2021-03-06 DIAGNOSIS — E559 Vitamin D deficiency, unspecified: Secondary | ICD-10-CM

## 2021-03-06 LAB — VITAMIN D 25 HYDROXY (VIT D DEFICIENCY, FRACTURES): VITD: 29.69 ng/mL — ABNORMAL LOW (ref 30.00–100.00)

## 2021-03-07 ENCOUNTER — Other Ambulatory Visit: Payer: Self-pay | Admitting: Internal Medicine

## 2021-03-07 DIAGNOSIS — E559 Vitamin D deficiency, unspecified: Secondary | ICD-10-CM

## 2021-03-07 MED ORDER — VITAMIN D (ERGOCALCIFEROL) 1.25 MG (50000 UNIT) PO CAPS: 50000.0000 [IU] | ORAL_CAPSULE | ORAL | 0 refills | Status: AC

## 2021-04-08 ENCOUNTER — Encounter: Payer: Self-pay | Admitting: Internal Medicine

## 2021-04-09 ENCOUNTER — Telehealth (INDEPENDENT_AMBULATORY_CARE_PROVIDER_SITE_OTHER): Payer: 59 | Admitting: Family Medicine

## 2021-04-09 ENCOUNTER — Encounter: Payer: Self-pay | Admitting: Family Medicine

## 2021-04-09 DIAGNOSIS — R059 Cough, unspecified: Secondary | ICD-10-CM | POA: Diagnosis not present

## 2021-04-09 DIAGNOSIS — R0981 Nasal congestion: Secondary | ICD-10-CM

## 2021-04-09 MED ORDER — AMOXICILLIN-POT CLAVULANATE 875-125 MG PO TABS: 1.0000 | ORAL_TABLET | Freq: Two times a day (BID) | ORAL | 0 refills | Status: DC

## 2021-04-09 MED ORDER — BENZONATATE 200 MG PO CAPS: 200.0000 mg | ORAL_CAPSULE | Freq: Two times a day (BID) | ORAL | 0 refills | Status: DC | PRN

## 2021-04-09 NOTE — Progress Notes (Signed)
Virtual Visit via Video Note  I connected with Travis Burton  on 04/09/21 at  3:00 PM EDT by a video enabled telemedicine application and verified that I am speaking with the correct person using two identifiers.  Location patient: home, [place] Location provider:work or home office Persons participating in the virtual visit: patient, provider  I discussed the limitations of evaluation and management by telemedicine and the availability of in person appointments. The patient expressed understanding and agreed to proceed.   HPI:  Acute telemedicine visit for cough/sinus congestion: -Onset: about 10 days ago or more -covid test was negative today, wife was sick too and had negative covid test -Symptoms include: started with body aches, fevers, cough, congestion, sore throat, then started to improve a little, but started to feel worse the last several days with worsening cough with green sputum and some sinus discomfort and chills -Denies: fever, CP, SOB, NVD, inability to eat/drink/get out of bed -Has tried:tylenol, nyquil -Pertinent past medical history:see below -Pertinent medication allergies: Allergies  Allergen Reactions   Allopurinol Hives  -COVID-19 vaccine status: 2 doses - no booster; has not had flu yet this year  ROS: See pertinent positives and negatives per HPI.  Past Medical History:  Diagnosis Date   Arthritis    both shoulders    Cancer ([place])    lymphoma   Chronic dental infection 16/03/9603   Grade 3a follicular lymphoma of lymph nodes of multiple regions ([place]) 12/15/2014   Neck mass    left   Transfusion of blood product refused for religious reason    Tuberculosis 2009   treatment as a young person and treatment in 2009    Past Surgical History:  Procedure Laterality Date   CHOLECYSTECTOMY     MASS BIOPSY Left 11/28/2014   Procedure: OPEN  BIOPSY LEFT NECK MASS;  Surgeon: Melissa Montane, MD;  Location: [facility];  Service: ENT;  Laterality: Left;    [place]  2004     Current Outpatient Medications:    Vitamin D, Ergocalciferol, (DRISDOL) 1.25 MG (50000 UNIT) CAPS capsule, Take 1 capsule (50,000 Units total) by mouth every 7 (seven) days for 12 doses., Disp: 12 capsule, Rfl: 0   amoxicillin-clavulanate (AUGMENTIN) 875-125 MG tablet, Take 1 tablet by mouth 2 (two) times daily., Disp: 20 tablet, Rfl: 0   benzonatate (TESSALON) 200 MG capsule, Take 1 capsule (200 mg total) by mouth 2 (two) times daily as needed., Disp: 20 capsule, Rfl: 0 No current facility-administered medications for this visit.  Facility-Administered Medications Ordered in Other Visits:    sodium chloride 0.9 % injection 10 mL, 10 mL, Intravenous, PRN, Alvy Bimler, Ni, MD, 10 mL at 01/26/15 1346   sodium chloride 0.9 % injection 10 mL, 10 mL, Intravenous, PRN, Alvy Bimler, Ni, MD  EXAM:  VITALS per patient if applicable: T 54.0 BP 981/19 and O2 is 96% on RA  GENERAL: alert, oriented, appears well and in no acute distress  HEENT: atraumatic, conjunttiva clear, no obvious abnormalities on inspection of external nose and ears  NECK: normal movements of the head and neck  LUNGS: on inspection no signs of respiratory distress, breathing rate appears normal, no obvious gross SOB, gasping or wheezing  CV: no obvious cyanosis  MS: moves all visible extremities without noticeable abnormality  PSYCH/NEURO: pleasant and cooperative, no obvious depression or anxiety, speech and thought processing grossly intact  ASSESSMENT AND PLAN:  Discussed the following assessment and plan:  Cough, unspecified type  Nasal congestion  -we discussed possible serious and likely etiologies, options for evaluation and workup, limitations of telemedicine visit vs in person visit, treatment, treatment risks and precautions. Pt is agreeable to treatment via telemedicine at this moment. Query influenza, covid vs other as initial illness with possible developing 2ndary  sinusitis or LRI given worsening. He wanted and rx for cough, Tessalon rx provided and Augmentin.  Advised to seek prompt in person care if worsening, new symptoms arise, or if is not improving with treatment. Discussed options for inperson care if PCP office not available. Did let this patient know that I only do telemedicine on Tuesdays and Thursdays for [facility]. Advised to schedule follow up visit with PCP or [facility] if any further questions or concerns to avoid delays in care.   I discussed the assessment and treatment plan with the patient. The patient was provided an opportunity to ask questions and all were answered. The patient agreed with the plan and demonstrated an understanding of the instructions.     Lucretia Kern, DO

## 2021-04-09 NOTE — Patient Instructions (Signed)
-  I sent the medication(s) we discussed to your pharmacy: Meds ordered this encounter  Medications   benzonatate (TESSALON) 200 MG capsule    Sig: Take 1 capsule (200 mg total) by mouth 2 (two) times daily as needed.    Dispense:  20 capsule    Refill:  0   amoxicillin-clavulanate (AUGMENTIN) 875-125 MG tablet    Sig: Take 1 tablet by mouth 2 (two) times daily.    Dispense:  20 tablet    Refill:  0     I hope you are feeling better soon!  Seek in person care promptly if your symptoms worsen, new concerns arise or you are not improving with treatment.  It was nice to meet you today. I help [facility] out with telemedicine visits on Tuesdays and Thursdays and am available for visits on those days. If you have any concerns or questions following this visit please schedule a follow up visit with your Primary Care doctor or seek care at a local urgent care clinic to avoid delays in care.

## 2021-04-16 ENCOUNTER — Telehealth: Payer: Self-pay | Admitting: Internal Medicine

## 2021-04-16 DIAGNOSIS — Z8572 Personal history of non-Hodgkin lymphomas: Secondary | ICD-10-CM

## 2021-04-16 NOTE — Telephone Encounter (Signed)
Patient is wanting referral to [facility] for yearly check up. Wants to see Dr.Surcush.      Good callback number is [phone]    Please Advise

## 2021-04-17 NOTE — Telephone Encounter (Signed)
Pt states he was last seen at that office in 2019, changed insurance, & now has a different insurance & would like to reestablish at the [facility].  Upon investigation of chart, referral was placed 08/22/20 however, not completed. Order placed again.

## 2021-04-19 ENCOUNTER — Telehealth: Payer: Self-pay | Admitting: Hematology and Oncology

## 2021-04-19 NOTE — Telephone Encounter (Signed)
Scheduled appt per 10/19 referral. Pt is aware of appt date and time.  

## 2021-04-23 ENCOUNTER — Ambulatory Visit (HOSPITAL_COMMUNITY)
Admission: RE | Admit: 2021-04-23 | Discharge: 2021-04-23 | Disposition: A | Discharge status: Home / Self Care | Payer: 59 | Source: Ambulatory Visit | Attending: Hematology and Oncology | Admitting: Hematology and Oncology

## 2021-04-23 ENCOUNTER — Other Ambulatory Visit: Payer: Self-pay

## 2021-04-23 ENCOUNTER — Encounter: Payer: Self-pay | Admitting: Hematology and Oncology

## 2021-04-23 ENCOUNTER — Inpatient Hospital Stay: Payer: 59 | Attending: Hematology and Oncology | Admitting: Hematology and Oncology

## 2021-04-23 VITALS — BP 134/83 | HR 77 | Temp 97.7°F | Resp 18 | Ht 67.0 in | Wt 142.4 lb

## 2021-04-23 DIAGNOSIS — Z8572 Personal history of non-Hodgkin lymphomas: Secondary | ICD-10-CM

## 2021-04-23 DIAGNOSIS — R058 Other specified cough: Secondary | ICD-10-CM

## 2021-04-23 DIAGNOSIS — R059 Cough, unspecified: Secondary | ICD-10-CM

## 2021-04-23 DIAGNOSIS — R351 Nocturia: Secondary | ICD-10-CM

## 2021-04-24 DIAGNOSIS — R351 Nocturia: Secondary | ICD-10-CM | POA: Insufficient documentation

## 2021-04-24 DIAGNOSIS — R059 Cough, unspecified: Secondary | ICD-10-CM | POA: Insufficient documentation

## 2021-04-24 NOTE — Assessment & Plan Note (Signed)
Clinically, the patient have no signs of disease recurrence The patient is more than 5 years out from treatment We discussed the risks and benefits of future follow-up The patient is comfortable to follow-up with primary care doctor for routine checkup He is educated to call me if he has felt new lymphadenopathy or concern for signs and symptoms of cancer recurrence

## 2021-04-24 NOTE — [place] Notes (Signed)
[place] CONSULT NOTE  Patient Care Team: Isaac Bliss, Rayford Halsted, MD as PCP - General (Internal Medicine) Melissa Montane, MD as Consulting Physician (Otolaryngology) Heath Lark, MD as Consulting Physician (Hematology and Oncology)  ASSESSMENT & PLAN:  History of B-cell lymphoma Clinically, the patient have no signs of disease recurrence The patient is more than 5 years out from treatment We discussed the risks and benefits of future follow-up The patient is comfortable to follow-up with primary care doctor for routine checkup He is educated to call me if he has felt new lymphadenopathy or concern for signs and symptoms of cancer recurrence   Cough in adult He had recent cough lasting over 3 weeks He did not tolerate Augmentin prescription due to severe nausea vomiting Since then, his cough is subsiding I have ordered a chest x-ray and it looks normal The patient is reassured  Nocturia He has frequent nocturia He denies dysuria, frequency or urgency He was told he had enlarged prostate in the past and his last PSA screen was 2 years ago I recommend follow-up with primary care doctor and have repeat PSA checked  Orders Placed This Encounter  Procedures   DG Chest 2 View    Standing Status:   Future    Number of Occurrences:   1    Standing Expiration Date:   04/23/2022    Order Specific Question:   Reason for exam:    Answer:   cough and night sweats x 3 weeks, Hx of lymphoma    Order Specific Question:   Preferred imaging location?    Answer:   [facility]    The total time spent in the appointment was 40 minutes encounter with patients including review of chart and various tests results, discussions about plan of care and coordination of care plan   All questions were answered. The patient knows to call the clinic with any problems, questions or concerns. No barriers to learning was detected.  Heath Lark, MD 10/26/202210:22 AM  CHIEF  COMPLAINTS/PURPOSE OF CONSULTATION:  History of diffuse large B-cell lymphoma  HISTORY OF PRESENTING ILLNESS:  Travis Burton 65 y.o. male is here because of history of diffuse large B-cell lymphoma He was lost to follow-up more than 3 years ago.  He stopped coming here due to insurance changes.  He was followed at another facility and now referred back here due to changes in insurance  I have reviewed his chart and materials related to his cancer extensively and collaborated history with the patient. Summary of oncologic history is as follows: Oncology History Overview Note  Follicular lymphoma grade 3a   Staging form: Lymphoid Neoplasms, AJCC 6th Edition     Clinical stage from 12/27/2014: Stage III - Signed by Heath Lark, MD on 12/27/2014      History of B-cell lymphoma  10/31/2014 Pathology Results   Accession: [id number] FNA showed atypical lymphoid population   11/28/2014 Pathology Results   Accession: [id number] Biopsy showed high grade follicular lymphoma   01/05/6282 Surgery   He had open biopsy of left neck mass   12/22/2014 Imaging   ECHo showed normal EF   12/22/2014 Imaging   PET scan showed stage III disease   12/22/2014 Bone Marrow Biopsy   Bone marrow biopsy was negative   01/05/2015 - 05/10/2015 Chemotherapy   He received R- [facility]OP chemotherapy x 6 cycles   02/14/2015 Imaging   PEt CT scan showed near complete response to Rx   04/19/2015 Adverse  Reaction    he has peripheral neuropathy. Dose of vincristine is reduced   06/06/2015 Imaging   PET CT showed persistent lymphadenopathy but not hypermetabolic   1/61/0960 PET scan   1. Stable exam. No evidence for hypermetabolic disease in the neck, chest, abdomen, or pelvis on today's study. 2. Interval removal of Port-A-Cath with FDG accumulation at the site of the port, compatible with granulation/healing.   He has no new lymphadenopathy His appetite is stable He is doing intermittent fasting for health  recent and has lost some weight recently He had cough lasting over 3 weeks.  He was prescribed 1 course of antibiotics with Augmentin but threw up after the first pill and he stopped taking antibiotics since  MEDICAL HISTORY:  Past Medical History:  Diagnosis Date   Arthritis    both shoulders    Cancer ([place])    lymphoma   Chronic dental infection 45/40/9811   Grade 3a follicular lymphoma of lymph nodes of multiple regions ([place]) 12/15/2014   Neck mass    left   Transfusion of blood product refused for religious reason    Tuberculosis 2009   treatment as a young person and treatment in 2009    SURGICAL HISTORY: Past Surgical History:  Procedure Laterality Date   CHOLECYSTECTOMY     COLONOSCOPY     MASS BIOPSY Left 11/28/2014   Procedure: OPEN  BIOPSY LEFT NECK MASS;  Surgeon: Melissa Montane, MD;  Location: [facility];  Service: ENT;  Laterality: Left;   VASECTOMY     VASECTOMY REVERSAL  06/30/2002    SOCIAL HISTORY: Social History   Socioeconomic History   Marital status: Married    Spouse name: Not on file   Number of children: Not on file   Years of education: Not on file   Highest education level: Not on file  Occupational History   Not on file  Tobacco Use   Smoking status: Never   Smokeless tobacco: Never  Vaping Use   Vaping Use: Never used  Substance and Sexual Activity   Alcohol use: Yes    Alcohol/week: 2.0 standard drinks    Types: 2 Cans of beer per week    Comment: social   Drug use: No   Sexual activity: Yes  Other Topics Concern   Not on file  Social History Narrative   Retired   Investment banker, operational of Radio broadcast assistant Strain: Not on file  Food Insecurity: Not on file  Transportation Needs: Not on file  Physical Activity: Not on file  Stress: Not on file  Social Connections: Not on file  Intimate Partner Violence: Not on file    FAMILY HISTORY: Family History  Problem Relation Age of Onset   Breast cancer Mother  44       breast ca   Colon cancer Neg Hx    Colon polyps Neg Hx    Esophageal cancer Neg Hx    Rectal cancer Neg Hx    Stomach cancer Neg Hx     ALLERGIES:  is allergic to allopurinol.  MEDICATIONS:  Current Outpatient Medications  Medication Sig Dispense Refill   Vitamin D, Ergocalciferol, (DRISDOL) 1.25 MG (50000 UNIT) CAPS capsule Take 1 capsule (50,000 Units total) by mouth every 7 (seven) days for 12 doses. 12 capsule 0   No current facility-administered medications for this visit.   Facility-Administered Medications Ordered in Other Visits  Medication Dose Route Frequency Provider Last Rate Last Admin   sodium  chloride 0.9 % injection 10 mL  10 mL Intravenous PRN Alvy Bimler, Grier Czerwinski, MD   10 mL at 01/26/15 1346   sodium chloride 0.9 % injection 10 mL  10 mL Intravenous PRN Heath Lark, MD        REVIEW OF SYSTEMS:   Constitutional: Denies fevers, chills or abnormal night sweats Eyes: Denies blurriness of vision, double vision or watery eyes Ears, nose, mouth, throat, and face: Denies mucositis or sore throat Cardiovascular: Denies palpitation, chest discomfort or lower extremity swelling Gastrointestinal:  Denies nausea, heartburn or change in bowel habits Skin: Denies abnormal skin rashes Lymphatics: Denies new lymphadenopathy or easy bruising Neurological:Denies numbness, tingling or new weaknesses Behavioral/Psych: Mood is stable, no new changes  All other systems were reviewed with the patient and are negative.  PHYSICAL EXAMINATION: ECOG PERFORMANCE STATUS: 1 - Symptomatic but completely ambulatory  Vitals:   04/23/21 1121  BP: 134/83  Pulse: 77  Resp: 18  Temp: 97.7 F (36.5 C)  SpO2: 100%   Filed Weights   04/23/21 1121  Weight: 142 lb 6.4 oz (64.6 kg)    GENERAL:alert, no distress and comfortable SKIN: skin color, texture, turgor are normal, no rashes or significant lesions EYES: normal, conjunctiva are pink and non-injected, sclera clear OROPHARYNX:no  exudate, no erythema and lips, buccal mucosa, and tongue normal  NECK: supple, thyroid normal size, non-tender, without nodularity LYMPH:  no palpable lymphadenopathy in the cervical, axillary or inguinal LUNGS: clear to auscultation and percussion with normal breathing effort HEART: regular rate & rhythm and no murmurs and no lower extremity edema ABDOMEN:abdomen soft, non-tender and normal bowel sounds Musculoskeletal:no cyanosis of digits and no clubbing  PSYCH: alert & oriented x 3 with fluent speech NEURO: no focal motor/sensory deficits  LABORATORY DATA:  I have reviewed the data as listed Lab Results  Component Value Date   WBC 4.8 11/15/2020   HGB 14.7 11/15/2020   HCT 43.3 11/15/2020   MCV 90.2 11/15/2020   PLT 231.0 11/15/2020   Recent Labs    11/15/20 1031  NA 140  K 4.2  CL 102  CO2 31  GLUCOSE 86  BUN 17  CREATININE 0.84  CALCIUM 10.1  PROT 7.0  ALBUMIN 4.9  AST 20  ALT 17  ALKPHOS 83  BILITOT 1.4*    RADIOGRAPHIC STUDIES: I have personally reviewed the radiological images as listed and agreed with the findings in the report. DG Chest 2 View  Result Date: 04/24/2021 CLINICAL DATA:  Cough and night sweats EXAM: CHEST - 2 VIEW COMPARISON:  None. FINDINGS: Normal mediastinum and cardiac silhouette. Normal pulmonary vasculature. No evidence of effusion, infiltrate, or pneumothorax. No acute bony abnormality. IMPRESSION: No acute cardiopulmonary process. Electronically Signed   By: Suzy Bouchard M.D.   On: 04/24/2021 10:09

## 2021-04-24 NOTE — Assessment & Plan Note (Signed)
He has frequent nocturia He denies dysuria, frequency or urgency He was told he had enlarged prostate in the past and his last PSA screen was 2 years ago I recommend follow-up with primary care doctor and have repeat PSA checked

## 2021-04-24 NOTE — Assessment & Plan Note (Signed)
He had recent cough lasting over 3 weeks He did not tolerate Augmentin prescription due to severe nausea vomiting Since then, his cough is subsiding I have ordered a chest x-ray and it looks normal The patient is reassured

## 2021-06-19 ENCOUNTER — Ambulatory Visit (INDEPENDENT_AMBULATORY_CARE_PROVIDER_SITE_OTHER): Payer: 59

## 2021-06-19 ENCOUNTER — Other Ambulatory Visit: Payer: Self-pay

## 2021-06-19 ENCOUNTER — Other Ambulatory Visit: Payer: Self-pay | Admitting: General Practice

## 2021-06-19 DIAGNOSIS — M25559 Pain in unspecified hip: Secondary | ICD-10-CM

## 2021-06-19 DIAGNOSIS — Z8572 Personal history of non-Hodgkin lymphomas: Secondary | ICD-10-CM

## 2021-10-31 ENCOUNTER — Telehealth: Payer: Self-pay | Admitting: Internal Medicine

## 2021-10-31 ENCOUNTER — Ambulatory Visit (INDEPENDENT_AMBULATORY_CARE_PROVIDER_SITE_OTHER): Payer: Medicare HMO | Admitting: Internal Medicine

## 2021-10-31 ENCOUNTER — Encounter: Payer: Self-pay | Admitting: Internal Medicine

## 2021-10-31 ENCOUNTER — Ambulatory Visit (INDEPENDENT_AMBULATORY_CARE_PROVIDER_SITE_OTHER): Payer: Medicare HMO

## 2021-10-31 VITALS — BP 130/80 | HR 70 | Temp 97.8°F | Wt 146.5 lb

## 2021-10-31 DIAGNOSIS — M542 Cervicalgia: Secondary | ICD-10-CM

## 2021-10-31 NOTE — Telephone Encounter (Signed)
Spoke with someone at [facility] and she will send a message to have the xray put on a CD. ?

## 2021-10-31 NOTE — Telephone Encounter (Signed)
Pt  had neck xray 5-4-2023and per pt he called [facility] and needs films put on CD and per pt  the office needs to call and send order to [facility] radiology that its ok for pt to have CD . [facility] radiology phone number is [phone] ?

## 2021-10-31 NOTE — [place] Notes (Signed)
? ? ? ?Established Patient Office Visit ? ? ? ? ?CC/Reason for Visit: Requesting neck x-ray ? ?HPI: Travis Burton is a 66 y.o. male who is coming in today for the above mentioned reasons.  He has had chronic neck pain stemming from an injury over 40 years ago when he lifted something heavy at his job.  When the pain gets really bad he visits a chiropractor and it resolves after a few adjustments.  When he recently went to see the chiropractor again he was told they would not adjust him unless they had a neck x-ray and this is why he is here today. ? ?He denies any symptoms of radiculopathy, hurts to move neck from side to side or to bend down. ? ?Past Medical/Surgical History: ?Past Medical History:  ?Diagnosis Date  ? Arthritis   ? both shoulders   ? Cancer [facility])   ? lymphoma  ? Chronic dental infection 04/19/2015  ? Grade 3a follicular lymphoma of lymph nodes of multiple regions ([place]) 12/15/2014  ? Neck mass   ? left  ? Transfusion of blood product refused for religious reason   ? Tuberculosis 2009  ? treatment as a young person and treatment in 2009  ? ? ?Past Surgical History:  ?Procedure Laterality Date  ? CHOLECYSTECTOMY    ? COLONOSCOPY    ? MASS BIOPSY Left 11/28/2014  ? Procedure: OPEN  BIOPSY LEFT NECK MASS;  Surgeon: Melissa Montane, MD;  Location: [facility];  Service: ENT;  Laterality: Left;  ? VASECTOMY    ? VASECTOMY REVERSAL  06/30/2002  ? ? ?Social History: ? reports that he has never smoked. He has never used smokeless tobacco. He reports current alcohol use of about 2.0 standard drinks per week. He reports that he does not use drugs. ? ?Allergies: ?Allergies  ?Allergen Reactions  ? Allopurinol Hives  ? ? ?Family History:  ?Family History  ?Problem Relation Age of Onset  ? Breast cancer Mother 42  ?     breast ca  ? Colon cancer Neg Hx   ? Colon polyps Neg Hx   ? Esophageal cancer Neg Hx   ? Rectal cancer Neg Hx   ? Stomach cancer Neg Hx   ? ? ?No current outpatient medications  on file. ?No current facility-administered medications for this visit. ? ?Facility-Administered Medications Ordered in Other Visits:  ?  sodium chloride 0.9 % injection 10 mL, 10 mL, Intravenous, PRN, Alvy Bimler, Ni, MD, 10 mL at 01/26/15 1346 ?  sodium chloride 0.9 % injection 10 mL, 10 mL, Intravenous, PRN, Heath Lark, MD ? ?Review of Systems:  ?Constitutional: Denies fever, chills, diaphoresis, appetite change and fatigue.  ?HEENT: Denies photophobia, eye pain, redness, hearing loss, ear pain, congestion, sore throat, rhinorrhea, sneezing, mouth sores, trouble swallowing, neck pain, neck stiffness and tinnitus.   ?Respiratory: Denies SOB, DOE, cough, chest tightness,  and wheezing.   ?Cardiovascular: Denies chest pain, palpitations and leg swelling.  ?Gastrointestinal: Denies nausea, vomiting, abdominal pain, diarrhea, constipation, blood in stool and abdominal distention.  ?Genitourinary: Denies dysuria, urgency, frequency, hematuria, flank pain and difficulty urinating.  ?Endocrine: Denies: hot or cold intolerance, sweats, changes in hair or nails, polyuria, polydipsia. ?Musculoskeletal: Denies myalgias, back pain, joint swelling, arthralgias and gait problem.  ?Skin: Denies pallor, rash and wound.  ?Neurological: Denies dizziness, seizures, syncope, weakness, light-headedness, numbness and headaches.  ?Hematological: Denies adenopathy. Easy bruising, personal or family bleeding history  ?Psychiatric/Behavioral: Denies suicidal ideation, mood changes, confusion, nervousness, sleep  disturbance and agitation ? ? ? ?Physical Exam: ?Vitals:  ? 10/31/21 1304  ?BP: 130/80  ?Pulse: 70  ?Temp: 97.8 ?F (36.6 ?C)  ?TempSrc: Oral  ?SpO2: 99%  ?Weight: 146 lb 8 oz (66.5 kg)  ? ? ?Body mass index is 22.95 kg/m?. ? ? ?Constitutional: NAD, calm, comfortable ?Eyes: PERRL, lids and conjunctivae normal ?ENMT: Mucous membranes are moist. P ?Psychiatric: Normal judgment and insight. Alert and oriented x 3. Normal mood.   ? ? ?Impression and Plan: ? ?Neck pain - Plan: DG Cervical Spine Complete per request. ? ? ? ?Time spent:20 minutes reviewing chart, interviewing and examining patient and formulating plan of care. ? ? ? ? ?Lelon Frohlich, MD ?[facility] ? ? ?

## 2021-11-20 ENCOUNTER — Encounter: Payer: Self-pay | Admitting: Internal Medicine

## 2021-11-20 ENCOUNTER — Ambulatory Visit (INDEPENDENT_AMBULATORY_CARE_PROVIDER_SITE_OTHER): Payer: Medicare HMO | Admitting: Internal Medicine

## 2021-11-20 VITALS — BP 180/90 | HR 61 | Temp 97.5°F | Ht 67.0 in | Wt 146.3 lb

## 2021-11-20 DIAGNOSIS — Z0000 Encounter for general adult medical examination without abnormal findings: Secondary | ICD-10-CM | POA: Diagnosis not present

## 2021-11-20 DIAGNOSIS — R030 Elevated blood-pressure reading, without diagnosis of hypertension: Secondary | ICD-10-CM

## 2021-11-20 DIAGNOSIS — Z8572 Personal history of non-Hodgkin lymphomas: Secondary | ICD-10-CM | POA: Diagnosis not present

## 2021-11-20 DIAGNOSIS — E559 Vitamin D deficiency, unspecified: Secondary | ICD-10-CM | POA: Diagnosis not present

## 2021-11-20 DIAGNOSIS — Z125 Encounter for screening for malignant neoplasm of prostate: Secondary | ICD-10-CM

## 2021-11-20 LAB — CBC WITH DIFFERENTIAL/PLATELET
Basophils Absolute: 0 10*3/uL (ref 0.0–0.1)
Basophils Relative: 0.6 % (ref 0.0–3.0)
Eosinophils Absolute: 0.1 10*3/uL (ref 0.0–0.7)
Eosinophils Relative: 2.5 % (ref 0.0–5.0)
HCT: 43.5 % (ref 39.0–52.0)
Hemoglobin: 14.7 g/dL (ref 13.0–17.0)
Lymphocytes Relative: 32.1 % (ref 12.0–46.0)
Lymphs Abs: 1.8 10*3/uL (ref 0.7–4.0)
MCHC: 33.9 g/dL (ref 30.0–36.0)
MCV: 91 fl (ref 78.0–100.0)
Monocytes Absolute: 0.4 10*3/uL (ref 0.1–1.0)
Monocytes Relative: 7.9 % (ref 3.0–12.0)
Neutro Abs: 3.1 10*3/uL (ref 1.4–7.7)
Neutrophils Relative %: 56.9 % (ref 43.0–77.0)
Platelets: 237 10*3/uL (ref 150.0–400.0)
RBC: 4.78 Mil/uL (ref 4.22–5.81)
RDW: 14.1 % (ref 11.5–15.5)
WBC: 5.5 10*3/uL (ref 4.0–10.5)

## 2021-11-20 LAB — LIPID PANEL
Cholesterol: 155 mg/dL (ref 0–200)
HDL: 73.3 mg/dL (ref >39.00)
LDL Cholesterol: 74 mg/dL (ref 0–99)
NonHDL: 82.12
Total CHOL/HDL Ratio: 2
Triglycerides: 39 mg/dL (ref 0.0–149.0)
VLDL: 7.8 mg/dL (ref 0.0–40.0)

## 2021-11-20 LAB — COMPREHENSIVE METABOLIC PANEL
ALT: 16 U/L (ref 0–53)
AST: 21 U/L (ref 0–37)
Albumin: 5 g/dL (ref 3.5–5.2)
Alkaline Phosphatase: 86 U/L (ref 39–117)
BUN: 16 mg/dL (ref 6–23)
CO2: 29 mEq/L (ref 19–32)
Calcium: 10.2 mg/dL (ref 8.4–10.5)
Chloride: 101 mEq/L (ref 96–112)
Creatinine, Ser: 0.85 mg/dL (ref 0.40–1.50)
GFR: 91.52 mL/min (ref >60.00)
Glucose, Bld: 80 mg/dL (ref 70–99)
Potassium: 4.4 mEq/L (ref 3.5–5.1)
Sodium: 140 mEq/L (ref 135–145)
Total Bilirubin: 0.6 mg/dL (ref 0.2–1.2)
Total Protein: 7.2 g/dL (ref 6.0–8.3)

## 2021-11-20 LAB — HEMOGLOBIN A1C: Hgb A1c MFr Bld: 5.5 % (ref 4.6–6.5)

## 2021-11-20 LAB — PSA: PSA: 3.41 ng/mL (ref 0.10–4.00)

## 2021-11-20 LAB — VITAMIN B12: Vitamin B-12: 231 pg/mL (ref 211–911)

## 2021-11-20 LAB — TSH: TSH: 2.36 u[IU]/mL (ref 0.35–5.50)

## 2021-11-20 LAB — VITAMIN D 25 HYDROXY (VIT D DEFICIENCY, FRACTURES): VITD: 62.14 ng/mL (ref 30.00–100.00)

## 2021-11-20 NOTE — [place] Notes (Signed)
Established Patient Office Visit     CC/Reason for Visit: Annual preventive exam and welcome to Medicare preventive exam  HPI: Travis Burton is a 66 y.o. male who is coming in today for the above mentioned reasons. Past Medical History is significant for: History of B-cell lymphoma, BPH, vitamin D deficiency.  He feels well and has no acute concerns or complaints.  He has routine dental care, is scheduled for an eye exam.  No perceived hearing issues.  He exercises by walking 3 miles 3-5 times a week.  He had a colonoscopy in 2022.  He is overdue for flu, COVID, pneumonia vaccines.   Past Medical/Surgical History: Past Medical History:  Diagnosis Date   Arthritis    both shoulders    Cancer ([place])    lymphoma   Chronic dental infection 66/44/0347   Grade 3a follicular lymphoma of lymph nodes of multiple regions ([place]) 12/15/2014   Neck mass    left   Transfusion of blood product refused for religious reason    Tuberculosis 2009   treatment as a young person and treatment in 2009    Past Surgical History:  Procedure Laterality Date   CHOLECYSTECTOMY     COLONOSCOPY     MASS BIOPSY Left 11/28/2014   Procedure: OPEN  BIOPSY LEFT NECK MASS;  Surgeon: Melissa Montane, MD;  Location: [facility];  Service: ENT;  Laterality: Left;   VASECTOMY     VASECTOMY REVERSAL  06/30/2002    Social History:  reports that he has never smoked. He has never used smokeless tobacco. He reports current alcohol use of about 2.0 standard drinks per week. He reports that he does not use drugs.  Allergies: Allergies  Allergen Reactions   Allopurinol Hives    Family History:  Family History  Problem Relation Age of Onset   Breast cancer Mother 61       breast ca   Colon cancer Neg Hx    Colon polyps Neg Hx    Esophageal cancer Neg Hx    Rectal cancer Neg Hx    Stomach cancer Neg Hx      Current Outpatient Medications:    Cholecalciferol (VITAMIN D3 PO), Take by  mouth., Disp: , Rfl:  No current facility-administered medications for this visit.  Facility-Administered Medications Ordered in Other Visits:    sodium chloride 0.9 % injection 10 mL, 10 mL, Intravenous, PRN, Alvy Bimler, Ni, MD, 10 mL at 01/26/15 1346   sodium chloride 0.9 % injection 10 mL, 10 mL, Intravenous, PRN, Alvy Bimler, Ni, MD  Review of Systems:  Constitutional: Denies fever, chills, diaphoresis, appetite change and fatigue.  HEENT: Denies photophobia, eye pain, redness, hearing loss, ear pain, congestion, sore throat, rhinorrhea, sneezing, mouth sores, trouble swallowing, neck pain, neck stiffness and tinnitus.   Respiratory: Denies SOB, DOE, cough, chest tightness,  and wheezing.   Cardiovascular: Denies chest pain, palpitations and leg swelling.  Gastrointestinal: Denies nausea, vomiting, abdominal pain, diarrhea, constipation, blood in stool and abdominal distention.  Genitourinary: Denies dysuria, urgency, frequency, hematuria, flank pain and difficulty urinating.  Endocrine: Denies: hot or cold intolerance, sweats, changes in hair or nails, polyuria, polydipsia. Musculoskeletal: Denies myalgias, back pain, joint swelling, arthralgias and gait problem.  Skin: Denies pallor, rash and wound.  Neurological: Denies dizziness, seizures, syncope, weakness, light-headedness, numbness and headaches.  Hematological: Denies adenopathy. Easy bruising, personal or family bleeding history  Psychiatric/Behavioral: Denies suicidal ideation, mood changes, confusion, nervousness, sleep disturbance and agitation  Physical Exam: Vitals:   11/20/21 0835 11/20/21 0836 11/20/21 0903  BP: (!) 140/100 130/90 (!) 180/90  Pulse: 61    Temp: (!) 97.5 F (36.4 C)    TempSrc: Oral    SpO2: 99%    Weight: 146 lb 4.8 oz (66.4 kg)    Height: '5\' 7"'$  (1.702 m)      Body mass index is 22.91 kg/m.   Constitutional: NAD, calm, comfortable Eyes: PERRL, lids and conjunctivae normal ENMT: Mucous  membranes are moist. Posterior pharynx clear of any exudate or lesions. Normal dentition. Tympanic membrane is pearly white, no erythema or bulging. Neck: normal, supple, no masses, no thyromegaly Respiratory: clear to auscultation bilaterally, no wheezing, no crackles. Normal respiratory effort. No accessory muscle use.  Cardiovascular: Regular rate and rhythm, no murmurs / rubs / gallops. No extremity edema. 2+ pedal pulses. No carotid bruits.  Abdomen: no tenderness, no masses palpated. No hepatosplenomegaly. Bowel sounds positive.  Musculoskeletal: no clubbing / cyanosis. No joint deformity upper and lower extremities. Good ROM, no contractures. Normal muscle tone.  Skin: no rashes, lesions, ulcers. No induration Neurologic: CN 2-12 grossly intact. Sensation intact, DTR normal. Strength 5/5 in all 4.  Psychiatric: Normal judgment and insight. Alert and oriented x 3. Normal mood.   Subsequent Medicare wellness visit   1. Risk factors, based on past  M,S,F -cardiovascular disease risk factors include age, gender   2.  Physical activities: Walks 3 miles   3.  Depression/mood: Stable, not depressed   4.  Hearing: No perceived issues   5.  ADL's: Independent in all ADLs   6.  Fall risk: Low fall risk   7.  Home safety: No problems identified   8.  Height weight, and visual acuity: height and weight as above, vision:  Vision Screening   Right eye Left eye Both eyes  Without correction 20/32-1 20/25-1 20/25  With correction        9.  Counseling: Advised to update immunization status and to follow blood pressures at home   10. Lab orders based on risk factors: Laboratory update will be reviewed   11. Referral : None today   12. Care plan: Follow-up with me in 3 months   13. Cognitive assessment: No cognitive impairment   14. Screening: Patient provided with a written and personalized 5-10 year screening schedule in the AVS. yes   15. Provider List Update: PCP only  16.  Advance Directives: Full code   17. Opioids: Patient is not on any opioid prescriptions and has no risk factors for a substance use disorder.   [facility] Office Visit from 11/20/2021 in [facility] at [facility]  PHQ-9 Total Score 4          11/15/2020    9:59 AM 04/23/2021   11:00 AM 10/31/2021    1:11 PM 11/20/2021    8:39 AM  [place] in the past year? 0  0 0  Was there an injury with Fall?   0 0  Fall Risk Category Calculator   0 0  Fall Risk Category   Low Low  Patient Fall Risk Level Low fall risk Low fall risk Low fall risk Low fall risk  Patient at Risk for Falls Due to No Fall Risks  No Fall Risks No Fall Risks  Fall risk Follow up Falls evaluation completed  Falls evaluation completed Falls evaluation completed     Impression and Plan:  Welcome to Medicare preventive visit -Recommend routine  eye and dental care. -Immunizations: Overdue for flu, COVID, pneumonia vaccination.  Declines all despite counseling -Healthy lifestyle discussed in detail. -Labs to be updated today. -Colon cancer screening: 10/2020 -Breast cancer screening: Not applicable -Cervical cancer screening: Not applicable -Lung cancer screening: Not applicable -Prostate cancer screening: PSA today -DEXA: Not applicable  Vitamin D deficiency  - Plan: VITAMIN D 25 Hydroxy (Vit-D Deficiency, Fractures)  Elevated BP without diagnosis of hypertension -3 in office blood pressures today have been elevated.  He will do ambulatory blood pressure monitoring and return in 3 months for follow-up.  History of B-cell lymphoma  -On observation.    Patient Instructions  -Nice seeing you today!!  -Lab work today; will notify you once results are available.  -Check BP at home and bring measurements into your next visit in 3 months.  -Consider flu COVID and pneumonia vaccines.      Lelon Frohlich, MD [facility]

## 2021-11-20 NOTE — Patient Instructions (Signed)
-  Nice seeing you today!!  -Lab work today; will notify you once results are available.  -Check BP at home and bring measurements into your next visit in 3 months.  -Consider flu COVID and pneumonia vaccines.

## 2021-12-23 ENCOUNTER — Ambulatory Visit (INDEPENDENT_AMBULATORY_CARE_PROVIDER_SITE_OTHER): Payer: Medicare HMO

## 2021-12-23 ENCOUNTER — Ambulatory Visit (HOSPITAL_COMMUNITY)
Admission: EM | Admit: 2021-12-23 | Discharge: 2021-12-23 | Disposition: A | Discharge status: Home / Self Care | Payer: Medicare HMO | Attending: Sports Medicine | Admitting: Sports Medicine

## 2021-12-23 ENCOUNTER — Encounter (HOSPITAL_COMMUNITY): Payer: Self-pay | Admitting: Emergency Medicine

## 2021-12-23 DIAGNOSIS — M898X1 Other specified disorders of bone, shoulder: Secondary | ICD-10-CM

## 2021-12-23 DIAGNOSIS — R0789 Other chest pain: Secondary | ICD-10-CM

## 2021-12-23 DIAGNOSIS — M940 Chondrocostal junction syndrome [Tietze]: Secondary | ICD-10-CM

## 2021-12-23 MED ORDER — INDOMETHACIN 25 MG PO CAPS: 25.0000 mg | ORAL_CAPSULE | Freq: Three times a day (TID) | ORAL | 0 refills | Status: DC | PRN

## 2022-01-30 ENCOUNTER — Encounter: Payer: Self-pay | Admitting: Internal Medicine

## 2022-01-30 ENCOUNTER — Ambulatory Visit (INDEPENDENT_AMBULATORY_CARE_PROVIDER_SITE_OTHER): Payer: Medicare HMO | Admitting: Internal Medicine

## 2022-01-30 VITALS — BP 120/80 | HR 65 | Temp 97.5°F | Ht 66.0 in | Wt 147.2 lb

## 2022-01-30 DIAGNOSIS — R030 Elevated blood-pressure reading, without diagnosis of hypertension: Secondary | ICD-10-CM

## 2022-01-30 DIAGNOSIS — M62838 Other muscle spasm: Secondary | ICD-10-CM

## 2022-01-30 MED ORDER — MELOXICAM 7.5 MG PO TABS: 7.5000 mg | ORAL_TABLET | Freq: Every day | ORAL | 0 refills | Status: DC

## 2022-01-30 NOTE — Progress Notes (Signed)
Established Patient Office Visit     CC/Reason for Visit: Blood pressure follow-up  HPI: Travis Burton is a 66 y.o. male who is coming in today for the above mentioned reasons.  During his annual exam in May he was found to have elevated blood pressure on 3 consecutive measurements.  He was asked to do home measurements and return today for follow-up.  I will insert them below.  He has been feeling well.  In the interim he did had to visit the emergency department for a left what sounds like trapezius muscle spasm.  He took indomethacin which helped but problems persist.     Past Medical/Surgical History: Past Medical History:  Diagnosis Date   Arthritis    both shoulders    Cancer ([place])    lymphoma   Chronic dental infection 52/84/1324   Grade 3a follicular lymphoma of lymph nodes of multiple regions ([place]) 12/15/2014   Neck mass    left   Transfusion of blood product refused for religious reason    Tuberculosis 2009   treatment as a young person and treatment in 2009    Past Surgical History:  Procedure Laterality Date   CHOLECYSTECTOMY     COLONOSCOPY     MASS BIOPSY Left 11/28/2014   Procedure: OPEN  BIOPSY LEFT NECK MASS;  Surgeon: Melissa Montane, MD;  Location: [place];  Service: ENT;  Laterality: Left;   VASECTOMY     VASECTOMY REVERSAL  06/30/2002    Social History:  reports that he has never smoked. He has never used smokeless tobacco. He reports current alcohol use of about 2.0 standard drinks of alcohol per week. He reports that he does not use drugs.  Allergies: Allergies  Allergen Reactions   Allopurinol Hives    Family History:  Family History  Problem Relation Age of Onset   Breast cancer Mother 20       breast ca   Colon cancer Neg Hx    Colon polyps Neg Hx    Esophageal cancer Neg Hx    Rectal cancer Neg Hx    Stomach cancer Neg Hx      Current Outpatient Medications:    Cholecalciferol (VITAMIN D3 PO), Take  by mouth., Disp: , Rfl:    indomethacin (INDOCIN) 25 MG capsule, Take 1 capsule (25 mg total) by mouth 3 (three) times daily as needed., Disp: 30 capsule, Rfl: 0   meloxicam (MOBIC) 7.5 MG tablet, Take 1 tablet (7.5 mg total) by mouth daily., Disp: 30 tablet, Rfl: 0 No current facility-administered medications for this visit.  Facility-Administered Medications Ordered in Other Visits:    sodium chloride 0.9 % injection 10 mL, 10 mL, Intravenous, PRN, Alvy Bimler, Ni, MD, 10 mL at 01/26/15 1346   sodium chloride 0.9 % injection 10 mL, 10 mL, Intravenous, PRN, Alvy Bimler, Ni, MD  Review of Systems:  Constitutional: Denies fever, chills, diaphoresis, appetite change and fatigue.  HEENT: Denies photophobia, eye pain, redness, hearing loss, ear pain, congestion, sore throat, rhinorrhea, sneezing, mouth sores, trouble swallowing, neck pain, neck stiffness and tinnitus.   Respiratory: Denies SOB, DOE, cough, chest tightness,  and wheezing.   Cardiovascular: Denies chest pain, palpitations and leg swelling.  Gastrointestinal: Denies nausea, vomiting, abdominal pain, diarrhea, constipation, blood in stool and abdominal distention.  Genitourinary: Denies dysuria, urgency, frequency, hematuria, flank pain and difficulty urinating.  Endocrine: Denies: hot or cold intolerance, sweats, changes in hair or nails, polyuria, polydipsia. Musculoskeletal: Denies back pain,  joint swelling, arthralgias and gait problem.  Skin: Denies pallor, rash and wound.  Neurological: Denies dizziness, seizures, syncope, weakness, light-headedness, numbness and headaches.  Hematological: Denies adenopathy. Easy bruising, personal or family bleeding history  Psychiatric/Behavioral: Denies suicidal ideation, mood changes, confusion, nervousness, sleep disturbance and agitation    Physical Exam: Vitals:   01/30/22 0925  BP: 120/80  Pulse: 65  Temp: (!) 97.5 F (36.4 C)  TempSrc: Oral  SpO2: 98%  Weight: 147 lb 3.2 oz (66.8 kg)   Height: '5\' 6"'$  (1.676 m)    Body mass index is 23.76 kg/m.   Constitutional: NAD, calm, comfortable Eyes: PERRL, lids and conjunctivae normal ENMT: Mucous membranes are moist.  Respiratory: clear to auscultation bilaterally, no wheezing, no crackles. Normal respiratory effort. No accessory muscle use.  Cardiovascular: Regular rate and rhythm, no murmurs / rubs / gallops. No extremity edema. Psychiatric: Normal judgment and insight. Alert and oriented x 3. Normal mood.    Impression and Plan:  Elevated BP without diagnosis of hypertension -Based on ambulatory measurements, I think it is okay to observe without medication for now.  Muscle spasm  - Plan: meloxicam (MOBIC) 7.5 MG tablet -Have advised local massage therapy, as needed NSAID use.    Time spent:30 minutes reviewing chart, interviewing and examining patient and formulating plan of care.      Lelon Frohlich, MD [facility]

## 2022-02-11 DIAGNOSIS — H40013 Open angle with borderline findings, low risk, bilateral: Secondary | ICD-10-CM | POA: Diagnosis not present

## 2022-02-11 DIAGNOSIS — Z9889 Other specified postprocedural states: Secondary | ICD-10-CM | POA: Diagnosis not present

## 2022-02-11 DIAGNOSIS — H33321 Round hole, right eye: Secondary | ICD-10-CM | POA: Diagnosis not present

## 2022-02-11 DIAGNOSIS — H2513 Age-related nuclear cataract, bilateral: Secondary | ICD-10-CM | POA: Diagnosis not present

## 2022-02-13 ENCOUNTER — Encounter (INDEPENDENT_AMBULATORY_CARE_PROVIDER_SITE_OTHER): Payer: Self-pay | Admitting: Ophthalmology

## 2022-02-14 ENCOUNTER — Encounter (INDEPENDENT_AMBULATORY_CARE_PROVIDER_SITE_OTHER): Payer: Medicare HMO | Admitting: Ophthalmology

## 2022-02-14 DIAGNOSIS — H43813 Vitreous degeneration, bilateral: Secondary | ICD-10-CM

## 2022-02-14 DIAGNOSIS — H33301 Unspecified retinal break, right eye: Secondary | ICD-10-CM | POA: Diagnosis not present

## 2022-03-04 ENCOUNTER — Encounter (INDEPENDENT_AMBULATORY_CARE_PROVIDER_SITE_OTHER): Payer: Medicare HMO | Admitting: Ophthalmology

## 2022-03-19 ENCOUNTER — Encounter (INDEPENDENT_AMBULATORY_CARE_PROVIDER_SITE_OTHER): Payer: Medicare HMO | Admitting: Ophthalmology

## 2022-03-19 DIAGNOSIS — H33301 Unspecified retinal break, right eye: Secondary | ICD-10-CM

## 2022-03-19 DIAGNOSIS — H43813 Vitreous degeneration, bilateral: Secondary | ICD-10-CM

## 2022-07-15 ENCOUNTER — Ambulatory Visit (INDEPENDENT_AMBULATORY_CARE_PROVIDER_SITE_OTHER): Payer: Medicare HMO | Admitting: Internal Medicine

## 2022-07-15 ENCOUNTER — Encounter: Payer: Self-pay | Admitting: Internal Medicine

## 2022-07-15 VITALS — BP 153/83 | HR 70 | Temp 97.6°F | Wt 148.1 lb

## 2022-07-15 DIAGNOSIS — K219 Gastro-esophageal reflux disease without esophagitis: Secondary | ICD-10-CM

## 2022-07-15 MED ORDER — PANTOPRAZOLE SODIUM 40 MG PO TBEC: 40.0000 mg | DELAYED_RELEASE_TABLET | Freq: Every day | ORAL | 1 refills | Status: DC

## 2022-07-15 NOTE — Progress Notes (Signed)
Established Patient Office Visit     CC/Reason for Visit: Epigastric pain  HPI: Travis Burton is a 67 y.o. male who is coming in today for the above mentioned reasons.  He tells me he was diagnosed with H. pylori in the past and was treated for this.  For the past few weeks he has been having increased heartburn, epigastric pain, increased belching.  He has been using home remedies with garlic that helps some.  He has never had an upper endoscopy.   Past Medical/Surgical History: Past Medical History:  Diagnosis Date   Arthritis    both shoulders    Cancer ([place])    lymphoma   Chronic dental infection 72/02/4708   Grade 3a follicular lymphoma of lymph nodes of multiple regions ([place]) 12/15/2014   Neck mass    left   Transfusion of blood product refused for religious reason    Tuberculosis 2009   treatment as a young person and treatment in 2009    Past Surgical History:  Procedure Laterality Date   CHOLECYSTECTOMY     COLONOSCOPY     MASS BIOPSY Left 11/28/2014   Procedure: OPEN  BIOPSY LEFT NECK MASS;  Surgeon: Melissa Montane, MD;  Location: [place];  Service: ENT;  Laterality: Left;   VASECTOMY     VASECTOMY REVERSAL  06/30/2002    Social History:  reports that he has never smoked. He has never used smokeless tobacco. He reports current alcohol use of about 2.0 standard drinks of alcohol per week. He reports that he does not use drugs.  Allergies: Allergies  Allergen Reactions   Allopurinol Hives    Family History:  Family History  Problem Relation Age of Onset   Breast cancer Mother 60       breast ca   Colon cancer Neg Hx    Colon polyps Neg Hx    Esophageal cancer Neg Hx    Rectal cancer Neg Hx    Stomach cancer Neg Hx      Current Outpatient Medications:    Cholecalciferol (VITAMIN D3 PO), Take by mouth., Disp: , Rfl:    OVER THE COUNTER MEDICATION, NAC - liver cleaning, Disp: , Rfl:    pantoprazole (PROTONIX) 40 MG  tablet, Take 1 tablet (40 mg total) by mouth daily., Disp: 90 tablet, Rfl: 1 No current facility-administered medications for this visit.  Facility-Administered Medications Ordered in Other Visits:    sodium chloride 0.9 % injection 10 mL, 10 mL, Intravenous, PRN, Alvy Bimler, Ni, MD, 10 mL at 01/26/15 1346   sodium chloride 0.9 % injection 10 mL, 10 mL, Intravenous, PRN, Alvy Bimler, Ni, MD  Review of Systems:  Negative unless indicated in HPI.   Physical Exam: Vitals:   07/15/22 1527 07/15/22 1531  BP: (!) 150/88 (!) 153/83  Pulse: 70   Temp: 97.6 F (36.4 C)   TempSrc: Oral   SpO2: 99%   Weight: 148 lb 1.6 oz (67.2 kg)     Body mass index is 23.9 kg/m.   Physical Exam Vitals reviewed.  Constitutional:      Appearance: Normal appearance.  HENT:     Head: Normocephalic and atraumatic.  Eyes:     Conjunctiva/sclera: Conjunctivae normal.     Pupils: Pupils are equal, round, and reactive to light.  Skin:    General: Skin is warm and dry.  Neurological:     General: No focal deficit present.     Mental Status: He is alert  and oriented to person, place, and time.  Psychiatric:        Mood and Affect: Mood normal.        Behavior: Behavior normal.        Thought Content: Thought content normal.        Judgment: Judgment normal.      Impression and Plan:  Gastroesophageal reflux disease, unspecified whether esophagitis present - Plan: pantoprazole (PROTONIX) 40 MG tablet  -Suspect symptoms are related to GERD. -Will start empiric management with daily pantoprazole, if no improvement after 8 to 12 weeks consider GI referral, he does have a history of H. pylori that was diagnosed via serology.  Has never had an upper endoscopy.  Time spent:30 minutes reviewing chart, interviewing and examining patient and formulating plan of care.     Lelon Frohlich, MD [facility]

## 2022-07-24 ENCOUNTER — Encounter (INDEPENDENT_AMBULATORY_CARE_PROVIDER_SITE_OTHER): Payer: Medicare HMO | Admitting: Ophthalmology

## 2022-07-24 DIAGNOSIS — H43813 Vitreous degeneration, bilateral: Secondary | ICD-10-CM | POA: Diagnosis not present

## 2022-07-24 DIAGNOSIS — H33301 Unspecified retinal break, right eye: Secondary | ICD-10-CM | POA: Diagnosis not present

## 2022-12-09 ENCOUNTER — Encounter: Payer: Self-pay | Admitting: Internal Medicine

## 2022-12-09 ENCOUNTER — Ambulatory Visit (INDEPENDENT_AMBULATORY_CARE_PROVIDER_SITE_OTHER): Payer: Medicare HMO | Admitting: Internal Medicine

## 2022-12-09 VITALS — BP 160/90 | HR 66 | Temp 97.3°F | Ht 67.0 in | Wt 144.7 lb

## 2022-12-09 DIAGNOSIS — Z8572 Personal history of non-Hodgkin lymphomas: Secondary | ICD-10-CM | POA: Diagnosis not present

## 2022-12-09 DIAGNOSIS — Z0000 Encounter for general adult medical examination without abnormal findings: Secondary | ICD-10-CM | POA: Diagnosis not present

## 2022-12-09 DIAGNOSIS — Z1159 Encounter for screening for other viral diseases: Secondary | ICD-10-CM

## 2022-12-09 DIAGNOSIS — R5383 Other fatigue: Secondary | ICD-10-CM

## 2022-12-09 DIAGNOSIS — E559 Vitamin D deficiency, unspecified: Secondary | ICD-10-CM

## 2022-12-09 DIAGNOSIS — R030 Elevated blood-pressure reading, without diagnosis of hypertension: Secondary | ICD-10-CM | POA: Diagnosis not present

## 2022-12-09 DIAGNOSIS — R351 Nocturia: Secondary | ICD-10-CM

## 2022-12-09 LAB — CBC WITH DIFFERENTIAL/PLATELET
Basophils Absolute: 0 10*3/uL (ref 0.0–0.1)
Basophils Relative: 0.6 % (ref 0.0–3.0)
Eosinophils Absolute: 0.1 10*3/uL (ref 0.0–0.7)
Eosinophils Relative: 2 % (ref 0.0–5.0)
HCT: 43.2 % (ref 39.0–52.0)
Hemoglobin: 14.2 g/dL (ref 13.0–17.0)
Lymphocytes Relative: 27.5 % (ref 12.0–46.0)
Lymphs Abs: 1.4 10*3/uL (ref 0.7–4.0)
MCHC: 33 g/dL (ref 30.0–36.0)
MCV: 90.9 fl (ref 78.0–100.0)
Monocytes Absolute: 0.4 10*3/uL (ref 0.1–1.0)
Monocytes Relative: 7 % (ref 3.0–12.0)
Neutro Abs: 3.1 10*3/uL (ref 1.4–7.7)
Neutrophils Relative %: 62.9 % (ref 43.0–77.0)
Platelets: 247 10*3/uL (ref 150.0–400.0)
RBC: 4.75 Mil/uL (ref 4.22–5.81)
RDW: 14.2 % (ref 11.5–15.5)
WBC: 5 10*3/uL (ref 4.0–10.5)

## 2022-12-09 LAB — LIPID PANEL
Cholesterol: 153 mg/dL (ref 0–200)
HDL: 66.1 mg/dL (ref >39.00)
LDL Cholesterol: 75 mg/dL (ref 0–99)
NonHDL: 86.74
Total CHOL/HDL Ratio: 2
Triglycerides: 59 mg/dL (ref 0.0–149.0)
VLDL: 11.8 mg/dL (ref 0.0–40.0)

## 2022-12-09 LAB — COMPREHENSIVE METABOLIC PANEL
ALT: 12 U/L (ref 0–53)
AST: 17 U/L (ref 0–37)
Albumin: 4.7 g/dL (ref 3.5–5.2)
Alkaline Phosphatase: 81 U/L (ref 39–117)
BUN: 15 mg/dL (ref 6–23)
CO2: 30 mEq/L (ref 19–32)
Calcium: 9.7 mg/dL (ref 8.4–10.5)
Chloride: 102 mEq/L (ref 96–112)
Creatinine, Ser: 0.76 mg/dL (ref 0.40–1.50)
GFR: 93.98 mL/min (ref >60.00)
Glucose, Bld: 73 mg/dL (ref 70–99)
Potassium: 4.1 mEq/L (ref 3.5–5.1)
Sodium: 140 mEq/L (ref 135–145)
Total Bilirubin: 1 mg/dL (ref 0.2–1.2)
Total Protein: 7.2 g/dL (ref 6.0–8.3)

## 2022-12-09 LAB — VITAMIN D 25 HYDROXY (VIT D DEFICIENCY, FRACTURES): VITD: 37.24 ng/mL (ref 30.00–100.00)

## 2022-12-09 LAB — PSA: PSA: 3.5 ng/mL (ref 0.10–4.00)

## 2022-12-09 LAB — TSH: TSH: 2.55 u[IU]/mL (ref 0.35–5.50)

## 2022-12-09 LAB — VITAMIN B12: Vitamin B-12: 332 pg/mL (ref 211–911)

## 2022-12-09 NOTE — Patient Instructions (Signed)
-  Nice seeing you today!!  -Lab work today; will notify you once results are available.  -Update these vaccines: Pneumonia, flu, COVID, RSV.  -Schedule follow up in 3 months for your blood pressure.

## 2022-12-09 NOTE — Progress Notes (Signed)
Established Patient Office Visit     CC/Reason for Visit: Annual preventive exam and subsequent Medicare wellness visit  HPI: Travis Burton is a 67 y.o. male who is coming in today for the above mentioned reasons. Past Medical History is significant for: History of lymphoma followed by oncology that is in remission.  He is overdue for eye and dental care.  He is due for flu, COVID, pneumonia, RSV vaccines.  He had a colonoscopy in 2022.  He has been feeling well.   Past Medical/Surgical History: Past Medical History:  Diagnosis Date   Arthritis    both shoulders    Cancer ([facility])    lymphoma   Chronic dental infection 04/19/2015   Grade 3a follicular lymphoma of lymph nodes of multiple regions ([facility]) 12/15/2014   Neck mass    left   Transfusion of blood product refused for religious reason    Tuberculosis 2009   treatment as a young person and treatment in 2009    Past Surgical History:  Procedure Laterality Date   CHOLECYSTECTOMY     COLONOSCOPY     MASS BIOPSY Left 11/28/2014   Procedure: OPEN  BIOPSY LEFT NECK MASS;  Surgeon: Suzanna Obey, MD;  Location: [facility];  Service: ENT;  Laterality: Left;   VASECTOMY     VASECTOMY REVERSAL  06/30/2002    Social History:  reports that he has never smoked. He has never used smokeless tobacco. He reports current alcohol use of about 2.0 standard drinks of alcohol per week. He reports that he does not use drugs.  Allergies: Allergies  Allergen Reactions   Allopurinol Hives    Family History:  Family History  Problem Relation Age of Onset   Breast cancer Mother 70       breast ca   Colon cancer Neg Hx    Colon polyps Neg Hx    Esophageal cancer Neg Hx    Rectal cancer Neg Hx    Stomach cancer Neg Hx      Current Outpatient Medications:    Cholecalciferol (VITAMIN D3 PO), Take by mouth., Disp: , Rfl:   Review of Systems:  Negative unless indicated in HPI.   Physical Exam: Vitals:    12/09/22 0922 12/09/22 0946  BP: (!) 140/90 (!) 160/90  Pulse: 66   Temp: (!) 97.3 F (36.3 C)   TempSrc: Oral   SpO2: 99%   Weight: 144 lb 11.2 oz (65.6 kg)   Height: 5\' 7"  (1.702 m)     Body mass index is 22.66 kg/m.   Physical Exam Vitals reviewed.  Constitutional:      General: He is not in acute distress.    Appearance: Normal appearance. He is not ill-appearing, toxic-appearing or diaphoretic.  HENT:     Head: Normocephalic.     Right Ear: Tympanic membrane, ear canal and external ear normal. There is no impacted cerumen.     Left Ear: Tympanic membrane, ear canal and external ear normal. There is no impacted cerumen.     Nose: Nose normal.     Mouth/Throat:     Mouth: Mucous membranes are moist.     Pharynx: Oropharynx is clear. No oropharyngeal exudate or posterior oropharyngeal erythema.  Eyes:     General: No scleral icterus.       Right eye: No discharge.        Left eye: No discharge.     Conjunctiva/sclera: Conjunctivae normal.     Pupils:  Pupils are equal, round, and reactive to light.  Neck:     Vascular: No carotid bruit.  Cardiovascular:     Rate and Rhythm: Normal rate and regular rhythm.     Pulses: Normal pulses.     Heart sounds: Normal heart sounds.  Pulmonary:     Effort: Pulmonary effort is normal. No respiratory distress.     Breath sounds: Normal breath sounds.  Abdominal:     General: Abdomen is flat. Bowel sounds are normal.     Palpations: Abdomen is soft.  Musculoskeletal:        General: Normal range of motion.     Cervical back: Normal range of motion.  Skin:    General: Skin is warm and dry.  Neurological:     General: No focal deficit present.     Mental Status: He is alert and oriented to person, place, and time. Mental status is at baseline.  Psychiatric:        Mood and Affect: Mood normal.        Behavior: Behavior normal.        Thought Content: Thought content normal.        Judgment: Judgment normal.    Subsequent  Medicare wellness visit   1. Risk factors, based on past  M,S,F - Cardiac Risk Factors include: advanced age (>39men, >89 women);hypertension   2.  Physical activities: Dietary issues and exercise activities discussed:  Current Exercise Habits: Home exercise routine, Type of exercise: walking, Time (Minutes): 60, Frequency (Times/Week): 3, Weekly Exercise (Minutes/Week): 180, Intensity: Moderate   3.  Depression/mood:  Flowsheet Row Office Visit from 12/09/2022 in [facility] Total Score 5        4.  ADL's:    12/09/2022    9:44 AM  In your present state of health, do you have any difficulty performing the following activities:  Hearing? 0  Vision? 1  Difficulty concentrating or making decisions? 1  Comment concentrating  Walking or climbing stairs? 0  Dressing or bathing? 0  Doing errands, shopping? 0  Preparing Food and eating ? N  Using the Toilet? N  In the past six months, have you accidently leaked urine? N  Do you have problems with loss of bowel control? N  Managing your Medications? N  Managing your Finances? N  Housekeeping or managing your Housekeeping? N     5.  Fall risk:     11/20/2021    8:39 AM 12/23/2021    8:59 AM 07/15/2022    4:26 PM 12/09/2022    9:48 AM 12/09/2022   10:00 AM  Fall Risk  Falls in the past year? 0  0  0  Was there an injury with Fall? 0  0 0 0  Fall Risk Category Calculator 0  0  0  Fall Risk Category (Retired) Low      (RETIRED) Patient Fall Risk Level Low fall risk Low fall risk     Patient at Risk for Falls Due to No Fall Risks  No Fall Risks    Fall risk Follow up Falls evaluation completed  Falls evaluation completed Falls evaluation completed Falls evaluation completed     6.  Home safety: No problems identified   7.  Height weight, and visual acuity: height and weight as above, vision/hearing: Vision Screening   Right eye Left eye Both eyes  Without correction 20/20 20/20 20/20    With correction  8.  Counseling: Counseling given: Not Answered    9. Lab orders based on risk factors: Laboratory update will be reviewed   10. Cognitive assessment:        12/09/2022    9:48 AM  6CIT Screen  What Year? 0 points  What month? 0 points  What time? 0 points  Count back from 20 0 points  Months in reverse 0 points  Repeat phrase 0 points  Total Score 0 points     11. Screening: Patient provided with a written and personalized 5-10 year screening schedule in the AVS. Health Maintenance  Topic Date Due   Hepatitis C Screening  Never done   COVID-19 Vaccine (3 - Pfizer risk series) 08/19/2020   Pneumonia Vaccine (1 of 2 - PCV) 07/16/2023*   Flu Shot  01/29/2023   Medicare Annual Wellness Visit  12/09/2023   Colon Cancer Screening  11/21/2027   DTaP/Tdap/Td vaccine (3 - Td or Tdap) 08/22/2030   Zoster (Shingles) Vaccine  Completed   HPV Vaccine  Aged Out  *Topic was postponed. The date shown is not the original due date.    12. Provider List Update: Patient Care Team    Relationship Specialty Notifications Start End  Philip Aspen, Limmie Patricia, MD PCP - General Internal Medicine  08/22/20   Suzanna Obey, MD Consulting Physician Otolaryngology  12/15/14   Artis Delay, MD Consulting Physician Hematology and Oncology  12/15/14      13. Advance Directives: Does Patient Have a Medical Advance Directive?: No Would patient like information on creating a medical advance directive?: No - Patient declined  14. Opioids: Patient is not on any opioid prescriptions and has no risk factors for a substance use disorder.   15.   Goals      Protect My Health     Timeframe:  Long-Range Goal Priority:  Medium Start Date:                             Expected End Date:                       Follow Up Date 12/09/2023    - schedule and keep appointment for annual check-up    Why is this important?   Screening tests can find diseases early when they are easier  to treat.  Your doctor or nurse will talk with you about which tests are important for you.  Getting shots for common diseases like the flu and shingles will help prevent them.     Notes:          I have personally reviewed and noted the following in the patient's chart:   Medical and social history Use of alcohol, tobacco or illicit drugs  Current medications and supplements Functional ability and status Nutritional status Physical activity Advanced directives List of other physicians Hospitalizations, surgeries, and ER visits in previous 12 months Vitals Screenings to include cognitive, depression, and falls Referrals and appointments  In addition, I have reviewed and discussed with patient certain preventive protocols, quality metrics, and best practice recommendations. A written personalized care plan for preventive services as well as general preventive health recommendations were provided to patient.   Impression and Plan:  Encounter for subsequent annual wellness visit in Medicare patient  Vitamin D deficiency -     VITAMIN D 25 Hydroxy (Vit-D Deficiency, Fractures); Future  Elevated BP without diagnosis of hypertension -  CBC with Differential/Platelet; Future -     Comprehensive metabolic panel; Future -     Lipid panel; Future  History of B-cell lymphoma  Nocturia -     PSA; Future  Fatigue, unspecified type -     TSH; Future -     Vitamin B12; Future -     Testosterone, Free, Total, SHBG  Encounter for hepatitis C screening test for low risk patient -     Hepatitis C antibody; Future   -Recommend routine eye and dental care. -Healthy lifestyle discussed in detail. -Labs to be updated today. -Prostate cancer screening: PSA today. Health Maintenance  Topic Date Due   Hepatitis C Screening  Never done   COVID-19 Vaccine (3 - Pfizer risk series) 08/19/2020   Pneumonia Vaccine (1 of 2 - PCV) 07/16/2023*   Flu Shot  01/29/2023   Medicare Annual  Wellness Visit  12/09/2023   Colon Cancer Screening  11/21/2027   DTaP/Tdap/Td vaccine (3 - Td or Tdap) 08/22/2030   Zoster (Shingles) Vaccine  Completed   HPV Vaccine  Aged Out  *Topic was postponed. The date shown is not the original due date.     -Advised to update RSV, pneumonia, flu, COVID vaccines at pharmacy.  He declines today. -Blood pressure is again noted to be elevated.  He will do ambulatory blood pressure monitoring and return for follow-up.  He remains very hesitant to start any long-term antihypertensive treatment. -Advised to secure eye and dental care. -He is requesting testosterone levels to be drawn.  We have discussed reasons.  He brings up hair loss and ED.  We have counseled that levels are likely to be low and replacing testosterone levels does not really improve ED.  He would like to do this regardless.     Chaya Jan, MD [facility] at [facility]

## 2022-12-10 LAB — HEPATITIS C ANTIBODY: Hepatitis C Ab: NONREACTIVE

## 2022-12-10 LAB — TESTOSTERONE, FREE, TOTAL, SHBG: Testosterone: 531 ng/dL (ref 264–916)

## 2022-12-13 LAB — TESTOSTERONE, FREE, TOTAL, SHBG
Sex Hormone Binding: 48.2 nmol/L (ref 19.3–76.4)
Testosterone, Free: 6.5 pg/mL — ABNORMAL LOW (ref 6.6–18.1)

## 2023-02-18 DIAGNOSIS — Z9889 Other specified postprocedural states: Secondary | ICD-10-CM | POA: Diagnosis not present

## 2023-02-18 DIAGNOSIS — H2513 Age-related nuclear cataract, bilateral: Secondary | ICD-10-CM | POA: Diagnosis not present

## 2023-02-18 DIAGNOSIS — H40013 Open angle with borderline findings, low risk, bilateral: Secondary | ICD-10-CM | POA: Diagnosis not present

## 2023-02-18 DIAGNOSIS — H33301 Unspecified retinal break, right eye: Secondary | ICD-10-CM | POA: Diagnosis not present

## 2023-10-27 ENCOUNTER — Ambulatory Visit (INDEPENDENT_AMBULATORY_CARE_PROVIDER_SITE_OTHER): Payer: Medicare HMO | Admitting: Family Medicine

## 2023-10-27 ENCOUNTER — Ambulatory Visit: Payer: Medicare HMO | Admitting: Family Medicine

## 2023-10-27 DIAGNOSIS — Z0000 Encounter for general adult medical examination without abnormal findings: Secondary | ICD-10-CM | POA: Diagnosis not present

## 2023-10-27 NOTE — Patient Instructions (Signed)
 I really enjoyed getting to talk with you today! I am available on Tuesdays and Thursdays for virtual visits if you have any questions or concerns, or if I can be of any further assistance.   CHECKLIST FROM ANNUAL WELLNESS VISIT:  -Follow up (please call to schedule if not scheduled after visit):   -follow up with your doctor in person about the back issues and any other symptoms   -yearly for annual wellness visit with primary care office  Here is a list of your preventive care/health maintenance measures and the plan for each if any are due:  PLAN For any measures below that may be due:  -can get vaccines at the pharmacy  Health Maintenance  Topic Date Due   Pneumonia Vaccine 32+ Years old (1 of 1 - PCV) Never done   COVID-19 Vaccine (3 - Pfizer risk series) 08/19/2020   INFLUENZA VACCINE  01/29/2024   Medicare Annual Wellness (AWV)  10/26/2024   Colonoscopy  11/21/2027   DTaP/Tdap/Td (3 - Td or Tdap) 08/22/2030   Hepatitis C Screening  Completed   Zoster Vaccines- Shingrix   Completed   HPV VACCINES  Aged Out   Meningococcal B Vaccine  Aged Out    -See a dentist at least yearly  -Get your eyes checked and then per your eye specialist's recommendations  -Other issues addressed today:   -I have included below further information regarding a healthy whole foods based diet, physical activity guidelines for adults, stress management and opportunities for social connections. I hope you find this information useful.   -----------------------------------------------------------------------------------------------------------------------------------------------------------------------------------------------------------------------------------------------------------    NUTRITION: -eat real food: lots of colorful vegetables (half the plate) and fruits -5-7 servings of vegetables and fruits per day (fresh or steamed is best), exp. 2 servings of vegetables with lunch and dinner and  2 servings of fruit per day. Berries and greens such as kale and collards are great choices.  -consume on a regular basis:  fresh fruits, fresh veggies, fish, nuts, seeds, healthy oils (such as olive oil, avocado oil), whole grains (make sure for bread/pasta/crackers/etc., that the first ingredient on label contains the word "whole"), legumes. -can eat small amounts of dairy and lean meat (no larger than the palm of your hand), but avoid processed meats such as ham, bacon, lunch meat, etc. -drink water -try to avoid fast food and pre-packaged foods, processed meat, ultra processed foods/beverages (donuts, candy, etc.) -most experts advise limiting sodium to < 2300mg  per day, should limit further is any chronic conditions such as high blood pressure, heart disease, diabetes, etc. The American Heart Association advised that < 1500mg  is is ideal -try to avoid foods/beverages that contain any ingredients with names you do not recognize  -try to avoid foods/beverages  with added sugar or sweeteners/sweets  -try to avoid sweet drinks (including diet drinks): soda, juice, Gatorade, sweet tea, power drinks, diet drinks -try to avoid white rice, white bread, pasta (unless whole grain)  EXERCISE GUIDELINES FOR ADULTS: -if you wish to increase your physical activity, do so gradually and with the approval of your doctor -STOP and seek medical care immediately if you have any chest pain, chest discomfort or trouble breathing when starting or increasing exercise  -move and stretch your body, legs, feet and arms when sitting for long periods -Physical activity guidelines for optimal health in adults: -get at least 150 minutes per week of moderate exercise (can talk, but not sing); this is about 20-30 minutes of sustained activity 5-7 days per week or two  10-15 minute episodes of sustained activity 5-7 days per week -do some muscle building/resistance training/strength training at least 2 days per week  -balance  exercises 3+ days per week:   Stand somewhere where you have something sturdy to hold onto if you lose balance    1) lift up on toes, then back down, start with 5x per day and work up to 20x   2) stand and lift one leg straight out to the side so that foot is a few inches of the floor, start with 5x each side and work up to 20x each side   3) stand on one foot, start with 5 seconds each side and work up to 20 seconds on each side  If you need ideas or help with getting more active:  -Silver sneakers https://tools.silversneakers.com  -Walk with a Doc: http://www.duncan-williams.com/  -try to include resistance (weight lifting/strength building) and balance exercises twice per week: or the following link for ideas: http://castillo-powell.com/  BuyDucts.dk  STRESS MANAGEMENT: -can try meditating, or just sitting quietly with deep breathing while intentionally relaxing all parts of your body for 5 minutes daily -if you need further help with stress, anxiety or depression please follow up with your primary doctor or contact the wonderful folks at [facility]: [phone]  SOCIAL CONNECTIONS: -options in [place] if you wish to engage in more social and exercise related activities:  -Silver sneakers https://tools.silversneakers.com  -Walk with a Doc: http://www.duncan-williams.com/  -Check out the [facility] Active Adults 50+ section on the [place] [employer] (hiking clubs, book clubs, cards and games, chess, exercise classes, aquatic classes and much more) - see the website for details: https://www.[place].gov/departments/parks-recreation/active-adults50  -YouTube has lots of exercise videos for different ages and abilities as well  -[facility] (a variety of indoor and outdoor inperson activities for adults). [phone]. [address]  -Virtual Online  Classes (a variety of topics): see seniorplanet.org or call [phone]  -consider volunteering at a school, hospice center, church, senior center or elsewhere

## 2023-10-27 NOTE — Progress Notes (Signed)
 PATIENT CHECK-IN and HEALTH RISK ASSESSMENT QUESTIONNAIRE:  -completed by phone/video for upcoming Medicare Preventive Visit   Pre-Visit Check-in: 1)Vitals (height, wt, BP, etc) - record in vitals section for visit on day of visit Request home vitals (wt, BP, etc.) and enter into vitals, THEN update Vital Signs SmartPhrase below at the top of the HPI. See below.  2)Review and Update Medications, Allergies PMH, Surgeries, Social history in Epic 3)Hospitalizations in the last year with date/reason? n  4)Review and Update Care Team (patient's specialists) in Epic 5) Complete PHQ9 in Epic  6) Complete Fall Screening in Epic 7)Review all Health Maintenance Due and order under PCP if not done.  Medicare Wellness Patient Questionnaire:  Answer theses question about your habits: How often do you have a drink containing alcohol?rare, once a week How many drinks containing alcohol do you have on a typical day when you are drinking?1 Have you ever smoked? n How many packs a day do/did you smoke? na Do you use smokeless tobacco?n Do you use an illicit drugs?n On average, how many days per week do you engage in moderate to strenuous exercise (like a brisk walk)? 3-4 times per week, usually for 30-34 minutes for weight lifting/strength training; walks for 1 hour 1-2 days per week.  Typical breakfast: coffee, eggs, oatmeal sometimes Typical lunch: rice beans, fish/chicken or meat, salad Typical dinner: light dinner, nuts, fruits Typical snacks: nuts, fruits  Beverages: water, sometimes herbal tea  Answer theses question about your everyday activities: Can you perform most household chores?y Are you deaf or have significant trouble hearing?n Do you feel that you have a problem with memory?n Do you feel safe at home?y Last dentist visit?2x per year 8. Do you have any difficulty performing your everyday activities?n Are you having any difficulty walking, taking medications on your own, and or  difficulty managing daily home needs?n Do you have difficulty walking or climbing stairs?n Do you have difficulty dressing or bathing?n Do you have difficulty doing errands alone such as visiting a doctor's office or shopping?n Do you currently have any difficulty preparing food and eating?n Do you currently have any difficulty using the toilet?n Do you have any difficulty managing your finances?n Do you have any difficulties with housekeeping of managing your housekeeping?n   Do you have Advanced Directives in place (Living Will, Healthcare Power or Attorney)? y   Last eye Exam and location? Does yearly exam   Do you currently use prescribed or non-prescribed narcotic or opioid pain medications?n  Do you have a history or close family history of breast, ovarian, tubal or peritoneal cancer or a family member with BRCA (breast cancer susceptibility 1 and 2) gene mutations? Hx lymphoma survivor    ----------------------------------------------------------------------------------------------------------------------------------------------------------------------------------------------------------------------  Because this visit was a virtual/telehealth visit, some criteria may be missing or patient reported. Any vitals not documented were not able to be obtained and vitals that have been documented are patient reported.    MEDICARE ANNUAL PREVENTIVE CARE VISIT WITH PROVIDER (Welcome to Medicare, initial annual wellness or annual wellness exam)  Virtual Visit via Video  Note  I connected with Corrinne Din on 10/27/23  by  a video enabled telemedicine application and verified that I am speaking with the correct person using two identifiers.  Location patient: home Location provider:work or home office Persons participating in the virtual visit: patient, provider  Concerns and/or follow up today: doing well.  Had some GERD last night after eating cheese - epigastric and  stomach issues when  was lying down. Reports he does not tolerate dairy well.  Now doing better today. Deneis fevers, NVD, change in bowels, vlood in stools.  Also had some left lower back issues that started about a week ago, unsure of cause, now improving. Lifts and works out at [employer] Reports was TTP in muscles of lower back and felt the pain with certain movements. No radiation, weakness, numbness or other symptoms.    See HM section in Epic for other details of completed HM.    ROS: negative for report of fevers, unintentional weight loss, vision changes, vision loss, hearing loss or change,  sob, hemoptysis, melena, hematochezia, hematuria, falls, bleeding or bruising, thoughts of suicide or self harm, memory loss  Patient-completed extensive health risk assessment - reviewed and discussed with the patient: See Health Risk Assessment completed with patient prior to the visit either above or in recent phone note. This was reviewed in detailed with the patient today and appropriate recommendations, orders and referrals were placed as needed per Summary below and patient instructions.   Review of Medical History: -PMH, PSH, Family History and current specialty and care providers reviewed and updated and listed below   Patient Care Team: Zilphia Hilt, Charyl Coppersmith, MD as PCP - General (Internal Medicine) Vernadine Golas, MD as Consulting Physician (Otolaryngology) Almeda Jacobs, MD as Consulting Physician (Hematology and Oncology)   Past Medical History:  Diagnosis Date   Arthritis    both shoulders    Cancer [facility])    lymphoma   Chronic dental infection 04/19/2015   Grade 3a follicular lymphoma of lymph nodes of multiple regions (HCC) 12/15/2014   Neck mass    left   Transfusion of blood product refused for religious reason    Tuberculosis 2009   treatment as a young person and treatment in 2009    Past Surgical History:  Procedure Laterality Date   CHOLECYSTECTOMY     COLONOSCOPY      MASS BIOPSY Left 11/28/2014   Procedure: OPEN  BIOPSY LEFT NECK MASS;  Surgeon: Vernadine Golas, MD;  Location: [facility];  Service: ENT;  Laterality: Left;   VASECTOMY     VASECTOMY REVERSAL  06/30/2002    Social History   Socioeconomic History   Marital status: Married    Spouse name: Not on file   Number of children: Not on file   Years of education: Not on file   Highest education level: Not on file  Occupational History   Not on file  Tobacco Use   Smoking status: Never   Smokeless tobacco: Never  Vaping Use   Vaping status: Never Used  Substance and Sexual Activity   Alcohol use: Yes    Alcohol/week: 2.0 standard drinks of alcohol    Types: 2 Cans of beer per week    Comment: social   Drug use: No   Sexual activity: Yes  Other Topics Concern   Not on file  Social History Narrative   Retired   Chief Executive Officer Drivers of Corporate investment banker Strain: Low Risk  (12/09/2022)   Overall Financial Resource Strain (CARDIA)    Difficulty of Paying Living Expenses: Not hard at all  Food Insecurity: No Food Insecurity (12/09/2022)   Hunger Vital Sign    Worried About Running Out of Food in the Last Year: Never true    Ran Out of Food in the Last Year: Never true  Transportation Needs: No Transportation Needs (12/09/2022)   PRAPARE - Transportation  Lack of Transportation (Medical): No    Lack of Transportation (Non-Medical): No  Physical Activity: Sufficiently Active (12/09/2022)   Exercise Vital Sign    Days of Exercise per Week: 3 days    Minutes of Exercise per Session: 60 min  Stress: No Stress Concern Present (12/09/2022)   [employer] of Occupational Health - Occupational Stress Questionnaire    Feeling of Stress : Not at all  Social Connections: Socially Integrated (12/09/2022)   Social Connection and Isolation Panel [NHANES]    Frequency of Communication with Friends and Family: Twice a week    Frequency of Social Gatherings with Friends and  Family: Once a week    Attends Religious Services: More than 4 times per year    Active Member of [employer] or Organizations: No    Attends Engineer, structural: More than 4 times per year    Marital Status: Married  Catering manager Violence: Not At Risk (12/09/2022)   Humiliation, Afraid, Rape, and Kick questionnaire    Fear of Current or Ex-Partner: No    Emotionally Abused: No    Physically Abused: No    Sexually Abused: No    Family History  Problem Relation Age of Onset   Breast cancer Mother 61       breast ca   Colon cancer Neg Hx    Colon polyps Neg Hx    Esophageal cancer Neg Hx    Rectal cancer Neg Hx    Stomach cancer Neg Hx     Current Outpatient Medications on File Prior to Visit  Medication Sig Dispense Refill   Cholecalciferol (VITAMIN D3 PO) Take by mouth.     No current facility-administered medications on file prior to visit.    Allergies  Allergen Reactions   Allopurinol  Hives       Physical Exam Vitals requested from patient and listed below if patient had equipment and was able to obtain at home for this virtual visit: There were no vitals filed for this visit. Estimated body mass index is 22.66 kg/m as calculated from the following:   Height as of 12/09/22: 5\' 7"  (1.702 m).   Weight as of 12/09/22: 144 lb 11.2 oz (65.6 kg).  EKG (optional): deferred due to virtual visit  GENERAL: alert, oriented, no acute distress detected; full vision exam deferred due to pandemic and/or virtual encounter  HEENT: atraumatic, conjunttiva clear, no obvious abnormalities on inspection of external nose and ears  NECK: normal movements of the head and neck  LUNGS: on inspection no signs of respiratory distress, breathing rate appears normal, no obvious gross SOB, gasping or wheezing  CV: no obvious cyanosis  MS: moves all visible extremities without noticeable abnormality  PSYCH/NEURO: pleasant and cooperative, no obvious depression or anxiety, speech  and thought processing grossly intact, Cognitive function grossly intact  Flowsheet Row Office Visit from 12/09/2022 in [facility]  PHQ-9 Total Score 5           10/27/2023    4:41 PM 12/09/2022   10:02 AM 11/20/2021    8:39 AM 10/31/2021    1:11 PM 11/15/2020   10:00 AM  Depression screen PHQ 2/9  Decreased Interest 0 0 0 0 0  Down, Depressed, Hopeless 0 1 1 0 0  PHQ - 2 Score 0 1 1 0 0  Altered sleeping  3 1 3    Tired, decreased energy  0 0 0   Change in appetite  0 0 0  Feeling bad or failure about yourself   1 1 1    Trouble concentrating  0 1 1   Moving slowly or fidgety/restless  0 0 0   Suicidal thoughts  0 0 0   PHQ-9 Score  5 4 5    Difficult doing work/chores  Not difficult at all Not difficult at all Not difficult at all        12/23/2021    8:59 AM 07/15/2022    4:26 PM 12/09/2022    9:48 AM 12/09/2022   10:00 AM 10/27/2023    4:41 PM  Fall Risk  Falls in the past year?  0  0 0  Was there an injury with Fall?  0 0 0 0  Fall Risk Category Calculator  0  0 0  (RETIRED) Patient Fall Risk Level Low fall risk      Patient at Risk for Falls Due to  No Fall Risks     Fall risk Follow up  Falls evaluation completed Falls evaluation completed Falls evaluation completed      SUMMARY AND PLAN:  Encounter for subsequent annual wellness visit in Medicare patient Discussed applicable health maintenance/preventive health measures and advised and referred or ordered per patient preferences: -discussed recs/risks for vaccines due, advised can get at the pharmacy Health Maintenance  Topic Date Due   Pneumonia Vaccine 53+ Years old (1 of 1 - PCV) Never done   COVID-19 Vaccine (3 - Pfizer risk series) 08/19/2020   INFLUENZA VACCINE  01/29/2024   Medicare Annual Wellness (AWV)  10/26/2024   Colonoscopy  11/21/2027   DTaP/Tdap/Td (3 - Td or Tdap) 08/22/2030   Hepatitis C Screening  Completed   Zoster Vaccines- Shingrix   Completed   HPV VACCINES   Aged Out   Meningococcal B Vaccine  Aged Out     Education and counseling on the following was provided based on the above review of health and a plan/checklist for the patient, along with additional information discussed, was provided for the patient in the patient instructions :   -Advised and counseled on a healthy lifestyle - including the importance of a healthy diet, regular physical activity, social connections . -Reviewed patient's current diet. Advised and counseled on a whole foods based healthy diet. A summary of a healthy diet was provided in the Patient Instructions.  -reviewed patient's current physical activity level and discussed exercise guidelines for adults. Discussed community resources and ideas for safe exercise at home to assist in meeting exercise guideline recommendations in a safe and healthy way.  -Advise yearly dental visits at minimum and regular eye exams -Advised and counseled on alcohol safe limits, risks -advised to seek inperson evaluation for symptoms, he feel they are resolving but agrees to seek inperson care if any further indigestion or if back issues do not resolve completely over the next few days.   Follow up: see patient instructions   Patient Instructions  I really enjoyed getting to talk with you today! I am available on Tuesdays and Thursdays for virtual visits if you have any questions or concerns, or if I can be of any further assistance.   CHECKLIST FROM ANNUAL WELLNESS VISIT:  -Follow up (please call to schedule if not scheduled after visit):   -follow up with your doctor in person about the back issues and any other symptoms   -yearly for annual wellness visit with primary care office  Here is a list of your preventive care/health maintenance measures and the plan for each if  any are due:  PLAN For any measures below that may be due:  -can get vaccines at the pharmacy  Health Maintenance  Topic Date Due   Pneumonia Vaccine 68+  Years old (1 of 1 - PCV) Never done   COVID-19 Vaccine (3 - Pfizer risk series) 08/19/2020   INFLUENZA VACCINE  01/29/2024   Medicare Annual Wellness (AWV)  10/26/2024   Colonoscopy  11/21/2027   DTaP/Tdap/Td (3 - Td or Tdap) 08/22/2030   Hepatitis C Screening  Completed   Zoster Vaccines- Shingrix   Completed   HPV VACCINES  Aged Out   Meningococcal B Vaccine  Aged Out    -See a dentist at least yearly  -Get your eyes checked and then per your eye specialist's recommendations  -Other issues addressed today:   -I have included below further information regarding a healthy whole foods based diet, physical activity guidelines for adults, stress management and opportunities for social connections. I hope you find this information useful.   -----------------------------------------------------------------------------------------------------------------------------------------------------------------------------------------------------------------------------------------------------------    NUTRITION: -eat real food: lots of colorful vegetables (half the plate) and fruits -5-7 servings of vegetables and fruits per day (fresh or steamed is best), exp. 2 servings of vegetables with lunch and dinner and 2 servings of fruit per day. Berries and greens such as kale and collards are great choices.  -consume on a regular basis:  fresh fruits, fresh veggies, fish, nuts, seeds, healthy oils (such as olive oil, avocado oil), whole grains (make sure for bread/pasta/crackers/etc., that the first ingredient on label contains the word "whole"), legumes. -can eat small amounts of dairy and lean meat (no larger than the palm of your hand), but avoid processed meats such as ham, bacon, lunch meat, etc. -drink water -try to avoid fast food and pre-packaged foods, processed meat, ultra processed foods/beverages (donuts, candy, etc.) -most experts advise limiting sodium to < 2300mg  per day, should limit  further is any chronic conditions such as high blood pressure, heart disease, diabetes, etc. The American Heart Association advised that < 1500mg  is is ideal -try to avoid foods/beverages that contain any ingredients with names you do not recognize  -try to avoid foods/beverages  with added sugar or sweeteners/sweets  -try to avoid sweet drinks (including diet drinks): soda, juice, Gatorade, sweet tea, power drinks, diet drinks -try to avoid white rice, white bread, pasta (unless whole grain)  EXERCISE GUIDELINES FOR ADULTS: -if you wish to increase your physical activity, do so gradually and with the approval of your doctor -STOP and seek medical care immediately if you have any chest pain, chest discomfort or trouble breathing when starting or increasing exercise  -move and stretch your body, legs, feet and arms when sitting for long periods -Physical activity guidelines for optimal health in adults: -get at least 150 minutes per week of moderate exercise (can talk, but not sing); this is about 20-30 minutes of sustained activity 5-7 days per week or two 10-15 minute episodes of sustained activity 5-7 days per week -do some muscle building/resistance training/strength training at least 2 days per week  -balance exercises 3+ days per week:   Stand somewhere where you have something sturdy to hold onto if you lose balance    1) lift up on toes, then back down, start with 5x per day and work up to 20x   2) stand and lift one leg straight out to the side so that foot is a few inches of the floor, start with 5x each side and work up to  20x each side   3) stand on one foot, start with 5 seconds each side and work up to 20 seconds on each side  If you need ideas or help with getting more active:  -Silver sneakers https://tools.silversneakers.com  -Walk with a Doc: http://www.duncan-williams.com/  -try to include resistance (weight lifting/strength building) and balance exercises twice per week: or the  following link for ideas: http://castillo-powell.com/  BuyDucts.dk  STRESS MANAGEMENT: -can try meditating, or just sitting quietly with deep breathing while intentionally relaxing all parts of your body for 5 minutes daily -if you need further help with stress, anxiety or depression please follow up with your primary doctor or contact the wonderful folks at [facility]: [phone]  SOCIAL CONNECTIONS: -options in [place] if you wish to engage in more social and exercise related activities:  -Silver sneakers https://tools.silversneakers.com  -Walk with a Doc: http://www.duncan-williams.com/  -Check out the [facility] Active Adults 50+ section on the [place] of [employer] (hiking clubs, book clubs, cards and games, chess, exercise classes, aquatic classes and much more) - see the website for details: https://www.[place].gov/departments/parks-recreation/active-adults50  -YouTube has lots of exercise videos for different ages and abilities as well  -[facility] (a variety of indoor and outdoor inperson activities for adults). ([phone])501-2605. [address]  -Virtual Online Classes (a variety of topics): see seniorplanet.org or call [phone]  -consider volunteering at a school, hospice center, church, senior center or elsewhere            Maurie Southern, DO

## 2023-10-30 ENCOUNTER — Telehealth: Payer: Self-pay

## 2023-10-30 DIAGNOSIS — Z8572 Personal history of non-Hodgkin lymphomas: Secondary | ICD-10-CM

## 2023-10-30 DIAGNOSIS — R1084 Generalized abdominal pain: Secondary | ICD-10-CM

## 2023-10-30 NOTE — Telephone Encounter (Signed)
 Copied from CRM [id number]. Topic: Referral - Request for Referral >> Oct 30, 2023 10:26 AM [name] wrote: Did the patient discuss referral with their provider in the last year? Yes (If No - schedule appointment) (If Yes - send message)  Appointment offered? No  Type of order/referral and detailed reason for visit: oncologist [email or web address]   Preference of office, provider, location: cancer center [place]   If referral order, have you been seen by this specialty before? Yes (If Yes, this issue or another issue? When? Where?  Can we respond through MyChart? Yes       Patient would like a call back to see if he needs to come In and be seen are can he just get a referral

## 2023-11-02 ENCOUNTER — Telehealth: Payer: Self-pay

## 2023-11-02 NOTE — Telephone Encounter (Signed)
 See other phone note 11/02/23

## 2023-11-02 NOTE — Telephone Encounter (Signed)
 Copied from CRM [id number]. Topic: Referral - Request for Referral >> Oct 30, 2023 10:26 AM [name] wrote: Did the patient discuss referral with their provider in the last year? Yes (If No - schedule appointment) (If Yes - send message)  Appointment offered? No  Type of order/referral and detailed reason for visit: oncologist [email or web address]   Preference of office, provider, location: cancer center [place]   If referral order, have you been seen by this specialty before? Yes (If Yes, this issue or another issue? When? Where?  Can we respond through MyChart? Yes       Patient would like a call back to see if he needs to come In and be seen are can he just get a referral >> Nov 02, 2023 10:57 AM [name] wrote: Patient says he is needing oncology referral because he is having a pain between ribs and stomach, patient is cancer survivor and worried this could be cancer related. Patient says communication regarding this can be continued via MyChart.

## 2023-11-02 NOTE — Telephone Encounter (Signed)
 Patient says he is needing oncology referral because he is having a pain between ribs and stomach, patient is cancer survivor and worried this could be cancer related. Patient says communication regarding this can be continued via MyChart.

## 2023-11-02 NOTE — Addendum Note (Signed)
 Addended by: Nicolina Barrios B on: 11/02/2023 01:31 PM   Modules accepted: Orders

## 2023-11-02 NOTE — Telephone Encounter (Signed)
 Referral placed.

## 2023-11-03 ENCOUNTER — Telehealth: Payer: Self-pay | Admitting: Hematology and Oncology

## 2023-11-03 NOTE — Telephone Encounter (Signed)
 Confirmed with pt scheduled appt date and time

## 2023-11-05 ENCOUNTER — Encounter: Payer: Self-pay | Admitting: Hematology and Oncology

## 2023-11-05 ENCOUNTER — Inpatient Hospital Stay: Attending: Hematology and Oncology | Admitting: Hematology and Oncology

## 2023-11-05 ENCOUNTER — Inpatient Hospital Stay

## 2023-11-05 VITALS — BP 148/80 | HR 64 | Temp 97.5°F | Resp 18 | Ht 67.0 in | Wt 146.0 lb

## 2023-11-05 DIAGNOSIS — R61 Generalized hyperhidrosis: Secondary | ICD-10-CM

## 2023-11-05 DIAGNOSIS — Z8572 Personal history of non-Hodgkin lymphomas: Secondary | ICD-10-CM | POA: Diagnosis not present

## 2023-11-05 LAB — CMP (CANCER CENTER ONLY)
ALT: 16 U/L (ref 0–44)
AST: 19 U/L (ref 15–41)
Albumin: 4.7 g/dL (ref 3.5–5.0)
Alkaline Phosphatase: 83 U/L (ref 38–126)
Anion gap: 3 — ABNORMAL LOW (ref 5–15)
BUN: 12 mg/dL (ref 8–23)
CO2: 34 mmol/L — ABNORMAL HIGH (ref 22–32)
Calcium: 9.5 mg/dL (ref 8.9–10.3)
Chloride: 103 mmol/L (ref 98–111)
Creatinine: 0.84 mg/dL (ref 0.61–1.24)
GFR, Estimated: >60 mL/min (ref >60)
Glucose, Bld: 92 mg/dL (ref 70–99)
Potassium: 4.3 mmol/L (ref 3.5–5.1)
Sodium: 140 mmol/L (ref 135–145)
Total Bilirubin: 0.8 mg/dL (ref 0.0–1.2)
Total Protein: 7.1 g/dL (ref 6.5–8.1)

## 2023-11-05 LAB — CBC WITH DIFFERENTIAL (CANCER CENTER ONLY)
Abs Immature Granulocytes: 0.02 10*3/uL (ref 0.00–0.07)
Basophils Absolute: 0 10*3/uL (ref 0.0–0.1)
Basophils Relative: 1 %
Eosinophils Absolute: 0.2 10*3/uL (ref 0.0–0.5)
Eosinophils Relative: 3 %
HCT: 42.6 % (ref 39.0–52.0)
Hemoglobin: 14.1 g/dL (ref 13.0–17.0)
Immature Granulocytes: 0 %
Lymphocytes Relative: 27 %
Lymphs Abs: 1.6 10*3/uL (ref 0.7–4.0)
MCH: 29.7 pg (ref 26.0–34.0)
MCHC: 33.1 g/dL (ref 30.0–36.0)
MCV: 89.7 fL (ref 80.0–100.0)
Monocytes Absolute: 0.5 10*3/uL (ref 0.1–1.0)
Monocytes Relative: 8 %
Neutro Abs: 3.6 10*3/uL (ref 1.7–7.7)
Neutrophils Relative %: 61 %
Platelet Count: 247 10*3/uL (ref 150–400)
RBC: 4.75 MIL/uL (ref 4.22–5.81)
RDW: 13.2 % (ref 11.5–15.5)
WBC Count: 5.8 10*3/uL (ref 4.0–10.5)
nRBC: 0 % (ref 0.0–0.2)

## 2023-11-05 LAB — LACTATE DEHYDROGENASE: LDH: 120 U/L (ref 98–192)

## 2023-11-05 NOTE — Assessment & Plan Note (Addendum)
 The patient was initially diagnosed with stage III high-grade follicular lymphoma in 2016, status post 6 cycles of R-CHOP chemotherapy with normal PET/CT imaging by September 2018 He was discharged from the clinic when I saw him last in 2022  The patient now complaining of intermittent abdominal pain/flank pain as well as frequent night sweats I am concerned about cancer recurrence I recommend repeat blood work and CT imaging next week to assess for cancer recurrence and he is in agreement

## 2023-11-05 NOTE — Progress Notes (Signed)
 [facility] OFFICE PROGRESS NOTE  Patient Care Team: Zilphia Hilt, Charyl Coppersmith, MD as PCP - General (Internal Medicine) Vernadine Golas, MD as Consulting Physician (Otolaryngology) Almeda Jacobs, MD as Consulting Physician (Hematology and Oncology)  Assessment & Plan History of B-cell lymphoma The patient was initially diagnosed with stage III high-grade follicular lymphoma in 2016, status post 6 cycles of R-CHOP chemotherapy with normal PET/CT imaging by September 2018 He was discharged from the clinic when I saw him last in 2022  The patient now complaining of intermittent abdominal pain/flank pain as well as frequent night sweats I am concerned about cancer recurrence I recommend repeat blood work and CT imaging next week to assess for cancer recurrence and he is in agreement Night sweats Likely due to testosterone  deficiency.  His last testosterone  level from June was adequate I am concerned about cancer recurrence and I explained to the patient the rationale behind ordering blood work and imaging studies and he agreed  Orders Placed This Encounter  Procedures   CT CHEST ABDOMEN PELVIS W CONTRAST    Standing Status:   Future    Expected Date:   11/12/2023    Expiration Date:   11/04/2024    If indicated for the ordered procedure, I authorize the administration of contrast media per Radiology protocol:   Yes    Does the patient have a contrast media/X-ray dye allergy?:   No    If indicated for the ordered procedure, I authorize the administration of oral contrast media per Radiology protocol:   No    Reason for no oral contrast::   no need    Preferred imaging location?:   [facility]   CMP (Cancer Center only)    Standing Status:   Future    Number of Occurrences:   1    Expiration Date:   11/04/2024   CBC with Differential (Cancer Center Only)    Standing Status:   Future    Number of Occurrences:   1    Expiration Date:   11/04/2024   Lactate dehydrogenase     Standing Status:   Future    Number of Occurrences:   1    Expiration Date:   11/04/2024     Almeda Jacobs, MD  INTERVAL HISTORY: he returns for urgent evaluation He was referred back to see me from his primary care doctor after complaining of intermittent left flank pain/abdominal pain for the last 2 weeks as well as frequent night sweats His appetite is stable No new lymphadenopathy Denies recent weight loss He is retired He is not able to exercise in the gym due to this pain in the flank area He denies urinary frequency, urgency or dysuria No signs or symptoms to suggest infection He denies recent trauma  PHYSICAL EXAMINATION: ECOG PERFORMANCE STATUS: 0 - Asymptomatic  Vitals:   11/05/23 0957  BP: (!) 148/80  Pulse: 64  Resp: 18  Temp: (!) 97.5 F (36.4 C)  SpO2: 100%   Filed Weights   11/05/23 0957  Weight: 146 lb (66.2 kg)   GENERAL:alert, no distress and comfortable SKIN: skin color, texture, turgor are normal, no rashes or significant lesions EYES: normal, conjunctiva are pink and non-injected, sclera clear OROPHARYNX:no exudate, no erythema and lips, buccal mucosa, and tongue normal  NECK: supple, thyroid  normal size, non-tender, without nodularity LYMPH:  no palpable lymphadenopathy in the cervical, axillary or inguinal LUNGS: clear to auscultation and percussion with normal breathing effort HEART: regular rate &  rhythm and no murmurs and no lower extremity edema ABDOMEN:abdomen soft, non-tender and normal bowel sounds Musculoskeletal:no cyanosis of digits and no clubbing  PSYCH: alert & oriented x 3 with fluent speech NEURO: no focal motor/sensory deficits  Relevant data reviewed during this visit included CBC and CMP

## 2023-11-11 ENCOUNTER — Ambulatory Visit (HOSPITAL_BASED_OUTPATIENT_CLINIC_OR_DEPARTMENT_OTHER)
Admission: RE | Admit: 2023-11-11 | Discharge: 2023-11-11 | Disposition: A | Discharge status: Home / Self Care | Source: Ambulatory Visit | Attending: Hematology and Oncology | Admitting: Hematology and Oncology

## 2023-11-11 DIAGNOSIS — R61 Generalized hyperhidrosis: Secondary | ICD-10-CM | POA: Diagnosis not present

## 2023-11-11 DIAGNOSIS — Z8572 Personal history of non-Hodgkin lymphomas: Secondary | ICD-10-CM | POA: Diagnosis not present

## 2023-11-11 DIAGNOSIS — R932 Abnormal findings on diagnostic imaging of liver and biliary tract: Secondary | ICD-10-CM | POA: Diagnosis not present

## 2023-11-11 MED ORDER — IOHEXOL 300 MG/ML  SOLN
100.0000 mL | Freq: Once | INTRAMUSCULAR | Status: AC | PRN
  Administered 2023-11-11: 80 mL via INTRAVENOUS

## 2023-11-19 ENCOUNTER — Inpatient Hospital Stay: Admitting: Hematology and Oncology

## 2023-11-19 ENCOUNTER — Encounter: Payer: Self-pay | Admitting: Hematology and Oncology

## 2023-11-19 ENCOUNTER — Telehealth: Payer: Self-pay

## 2023-11-19 VITALS — BP 146/86 | HR 61 | Temp 98.0°F | Resp 18 | Ht 67.0 in | Wt 145.0 lb

## 2023-11-19 DIAGNOSIS — Z8572 Personal history of non-Hodgkin lymphomas: Secondary | ICD-10-CM

## 2023-11-19 DIAGNOSIS — R61 Generalized hyperhidrosis: Secondary | ICD-10-CM

## 2023-11-19 DIAGNOSIS — R109 Unspecified abdominal pain: Secondary | ICD-10-CM

## 2023-11-19 NOTE — Assessment & Plan Note (Addendum)
 The patient was initially diagnosed with stage III high-grade follicular lymphoma in 2016, status post 6 cycles of R-CHOP chemotherapy with normal PET/CT imaging by September 2018 He was discharged from the clinic when I saw him last in 2022  The patient now complaining of intermittent abdominal pain/flank pain as well as frequent night sweats I am concerned about cancer recurrence I have reviewed CT imaging from May 2025 along with blood work which show no evidence of cancer recurrence It is possible that his night sweats are due to testosterone  deficiency He is not keen to go on testosterone  replacement therapy He is known to have enlarged prostate and will follow with primary care doctor in that regard He does not need long-term follow-up with me

## 2023-11-19 NOTE — Progress Notes (Signed)
 [facility] OFFICE PROGRESS NOTE  Patient Care Team: Zilphia Hilt, Charyl Coppersmith, MD as PCP - General (Internal Medicine) Vernadine Golas, MD as Consulting Physician (Otolaryngology) Almeda Jacobs, MD as Consulting Physician (Hematology and Oncology)  Assessment & Plan History of B-cell lymphoma The patient was initially diagnosed with stage III high-grade follicular lymphoma in 2016, status post 6 cycles of R-CHOP chemotherapy with normal PET/CT imaging by September 2018 He was discharged from the clinic when I saw him last in 2022  The patient now complaining of intermittent abdominal pain/flank pain as well as frequent night sweats I am concerned about cancer recurrence I have reviewed CT imaging from May 2025 along with blood work which show no evidence of cancer recurrence It is possible that his night sweats are due to testosterone  deficiency He is not keen to go on testosterone  replacement therapy He is known to have enlarged prostate and will follow with primary care doctor in that regard He does not need long-term follow-up with me  No orders of the defined types were placed in this encounter.    Almeda Jacobs, MD  INTERVAL HISTORY: he returns for surveillance follow-up and review of test results due to recent weight loss, back pain and intermittent night sweats I reviewed blood work and CT imaging with the patient  PHYSICAL EXAMINATION: ECOG PERFORMANCE STATUS: 0 - Asymptomatic  Vitals:   11/19/23 0939  BP: (!) 146/86  Pulse: 61  Resp: 18  Temp: 98 F (36.7 C)  SpO2: 100%   Filed Weights   11/19/23 0939  Weight: 145 lb (65.8 kg)    Relevant data reviewed during this visit included CBC, CMP and CT imaging from May 2025

## 2023-11-19 NOTE — Telephone Encounter (Signed)
 Attempted to call patient on primary number regarding today's appointment. No answer. Left voicemail providing patient with clinic number to callback or reschedule.

## 2024-02-24 DIAGNOSIS — H2513 Age-related nuclear cataract, bilateral: Secondary | ICD-10-CM | POA: Diagnosis not present

## 2024-02-24 DIAGNOSIS — H33301 Unspecified retinal break, right eye: Secondary | ICD-10-CM | POA: Diagnosis not present

## 2024-02-24 DIAGNOSIS — H40013 Open angle with borderline findings, low risk, bilateral: Secondary | ICD-10-CM | POA: Diagnosis not present

## 2024-04-05 ENCOUNTER — Encounter: Payer: Self-pay | Admitting: Internal Medicine

## 2024-04-05 ENCOUNTER — Ambulatory Visit: Admitting: Internal Medicine

## 2024-04-05 VITALS — BP 130/82 | HR 76 | Temp 98.1°F | Ht 67.25 in | Wt 145.3 lb

## 2024-04-05 DIAGNOSIS — E559 Vitamin D deficiency, unspecified: Secondary | ICD-10-CM

## 2024-04-05 DIAGNOSIS — Z0000 Encounter for general adult medical examination without abnormal findings: Secondary | ICD-10-CM

## 2024-04-05 DIAGNOSIS — Z125 Encounter for screening for malignant neoplasm of prostate: Secondary | ICD-10-CM

## 2024-04-05 DIAGNOSIS — Z8572 Personal history of non-Hodgkin lymphomas: Secondary | ICD-10-CM

## 2024-04-05 DIAGNOSIS — L918 Other hypertrophic disorders of the skin: Secondary | ICD-10-CM

## 2024-04-05 LAB — LIPID PANEL
Cholesterol: 159 mg/dL (ref 0–200)
HDL: 70.4 mg/dL (ref >39.00)
LDL Cholesterol: 75 mg/dL (ref 0–99)
NonHDL: 89.09
Total CHOL/HDL Ratio: 2
Triglycerides: 72 mg/dL (ref 0.0–149.0)
VLDL: 14.4 mg/dL (ref 0.0–40.0)

## 2024-04-05 LAB — CBC WITH DIFFERENTIAL/PLATELET
Basophils Absolute: 0 K/uL (ref 0.0–0.1)
Basophils Relative: 0.6 % (ref 0.0–3.0)
Eosinophils Absolute: 0.2 K/uL (ref 0.0–0.7)
Eosinophils Relative: 2.8 % (ref 0.0–5.0)
HCT: 41.3 % (ref 39.0–52.0)
Hemoglobin: 13.8 g/dL (ref 13.0–17.0)
Lymphocytes Relative: 34.2 % (ref 12.0–46.0)
Lymphs Abs: 1.9 K/uL (ref 0.7–4.0)
MCHC: 33.4 g/dL (ref 30.0–36.0)
MCV: 89.5 fl (ref 78.0–100.0)
Monocytes Absolute: 0.4 K/uL (ref 0.1–1.0)
Monocytes Relative: 7.5 % (ref 3.0–12.0)
Neutro Abs: 3 K/uL (ref 1.4–7.7)
Neutrophils Relative %: 54.9 % (ref 43.0–77.0)
Platelets: 245 K/uL (ref 150.0–400.0)
RBC: 4.62 Mil/uL (ref 4.22–5.81)
RDW: 14.3 % (ref 11.5–15.5)
WBC: 5.5 K/uL (ref 4.0–10.5)

## 2024-04-05 LAB — COMPREHENSIVE METABOLIC PANEL WITH GFR
ALT: 13 U/L (ref 0–53)
AST: 18 U/L (ref 0–37)
Albumin: 4.9 g/dL (ref 3.5–5.2)
Alkaline Phosphatase: 70 U/L (ref 39–117)
BUN: 12 mg/dL (ref 6–23)
CO2: 30 meq/L (ref 19–32)
Calcium: 9.6 mg/dL (ref 8.4–10.5)
Chloride: 101 meq/L (ref 96–112)
Creatinine, Ser: 0.76 mg/dL (ref 0.40–1.50)
GFR: 93.11 mL/min (ref >60.00)
Glucose, Bld: 84 mg/dL (ref 70–99)
Potassium: 4.1 meq/L (ref 3.5–5.1)
Sodium: 139 meq/L (ref 135–145)
Total Bilirubin: 1.1 mg/dL (ref 0.2–1.2)
Total Protein: 7 g/dL (ref 6.0–8.3)

## 2024-04-05 LAB — TSH: TSH: 1.95 u[IU]/mL (ref 0.35–5.50)

## 2024-04-05 LAB — VITAMIN D 25 HYDROXY (VIT D DEFICIENCY, FRACTURES): VITD: 33.27 ng/mL (ref 30.00–100.00)

## 2024-04-05 LAB — VITAMIN B12: Vitamin B-12: 408 pg/mL (ref 211–911)

## 2024-04-05 LAB — PSA: PSA: 4.4 ng/mL — ABNORMAL HIGH (ref 0.10–4.00)

## 2024-04-05 NOTE — Addendum Note (Signed)
 Addended by: KATHRYNE MILLMAN B on: 04/05/2024 02:07 PM   Modules accepted: Orders

## 2024-04-05 NOTE — Progress Notes (Signed)
 Established Patient Office Visit     CC/Reason for Visit: Annual preventive exam  HPI: Travis Burton is a 68 y.o. male who is coming in today for the above mentioned reasons. Past Medical History is significant for: History of lymphoma on observation followed by oncology, vitamin D  deficiency.  Feeling well, no acute concerns or complaints, has routine eye and dental care.  Is due for flu, COVID, pneumonia vaccines, had his colonoscopy in 2022.   Past Medical/Surgical History: Past Medical History:  Diagnosis Date   Arthritis    both shoulders    Cancer (HCC)    lymphoma   Chronic dental infection 04/19/2015   Grade 3a follicular lymphoma of lymph nodes of multiple regions (HCC) 12/15/2014   Neck mass    left   Transfusion of blood product refused for religious reason    Tuberculosis 2009   treatment as a young person and treatment in 2009    Past Surgical History:  Procedure Laterality Date   CHOLECYSTECTOMY     COLONOSCOPY     MASS BIOPSY Left 11/28/2014   Procedure: OPEN  BIOPSY LEFT NECK MASS;  Surgeon: Norleen Notice, MD;  Location:  S[facility];  Service: ENT;  Laterality: Left;   VASECTOMY     VASECTOMY REVERSAL  06/30/2002    Social History:  reports that he has never smoked. He has never used smokeless tobacco. He reports current alcohol use of about 2.0 standard drinks of alcohol per week. He reports that he does not use drugs.  Allergies: Allergies  Allergen Reactions   Allopurinol  Hives    Family History:  Family History  Problem Relation Age of Onset   Breast cancer Mother 74       breast ca   Colon cancer Neg Hx    Colon polyps Neg Hx    Esophageal cancer Neg Hx    Rectal cancer Neg Hx    Stomach cancer Neg Hx      Current Outpatient Medications:    Cholecalciferol (VITAMIN D3 PO), Take by mouth. (Patient not taking: Reported on 04/05/2024), Disp: , Rfl:   Review of Systems:  Negative unless indicated in  HPI.   Physical Exam: Vitals:   04/05/24 1257  BP: 130/82  Pulse: 76  Temp: 98.1 F (36.7 C)  TempSrc: Oral  SpO2: 99%  Weight: 145 lb 4.8 oz (65.9 kg)  Height: 5' 7.25 (1.708 m)    Body mass index is 22.59 kg/m.   Physical Exam Vitals reviewed.  Constitutional:      General: He is not in acute distress.    Appearance: Normal appearance. He is not ill-appearing, toxic-appearing or diaphoretic.  HENT:     Head: Normocephalic.     Right Ear: Tympanic membrane, ear canal and external ear normal. There is no impacted cerumen.     Left Ear: Tympanic membrane, ear canal and external ear normal. There is no impacted cerumen.     Nose: Nose normal.     Mouth/Throat:     Mouth: Mucous membranes are moist.     Pharynx: Oropharynx is clear. No oropharyngeal exudate or posterior oropharyngeal erythema.  Eyes:     General: No scleral icterus.       Right eye: No discharge.        Left eye: No discharge.     Conjunctiva/sclera: Conjunctivae normal.     Pupils: Pupils are equal, round, and reactive to light.  Neck:     Vascular:  No carotid bruit.  Cardiovascular:     Rate and Rhythm: Normal rate and regular rhythm.     Pulses: Normal pulses.     Heart sounds: Normal heart sounds.  Pulmonary:     Effort: Pulmonary effort is normal. No respiratory distress.     Breath sounds: Normal breath sounds.  Abdominal:     General: Abdomen is flat. Bowel sounds are normal.     Palpations: Abdomen is soft.  Musculoskeletal:        General: Normal range of motion.     Cervical back: Normal range of motion.  Skin:    General: Skin is warm and dry.  Neurological:     General: No focal deficit present.     Mental Status: He is alert and oriented to person, place, and time. Mental status is at baseline.  Psychiatric:        Mood and Affect: Mood normal.        Behavior: Behavior normal.        Thought Content: Thought content normal.        Judgment: Judgment normal.       Impression and Plan:  Encounter for preventive health examination -     PSA; Future  History of B-cell lymphoma -     CBC with Differential/Platelet; Future -     Comprehensive metabolic panel with GFR; Future -     Lipid panel; Future -     TSH; Future -     Vitamin B12; Future  Vitamin D  deficiency -     VITAMIN D  25 Hydroxy (Vit-D Deficiency, Fractures); Future   -Recommend routine eye and dental care. -Healthy lifestyle discussed in detail. -Labs to be updated today. -Prostate cancer screening: PSA today Health Maintenance  Topic Date Due   Pneumococcal Vaccine for age over 45 (1 of 1 - PCV) Never done   COVID-19 Vaccine (3 - Pfizer risk series) 08/19/2020   Flu Shot  Never done   Medicare Annual Wellness Visit  10/26/2024   Colon Cancer Screening  11/21/2027   DTaP/Tdap/Td vaccine (3 - Td or Tdap) 08/22/2030   Hepatitis C Screening  Completed   Zoster (Shingles) Vaccine  Completed   Meningitis B Vaccine  Aged Out    - Declines all vaccines today despite counseling.   [name], MD  Primary Care at [facility]

## 2024-04-06 ENCOUNTER — Ambulatory Visit: Payer: Self-pay | Admitting: Internal Medicine

## 2024-04-06 DIAGNOSIS — R972 Elevated prostate specific antigen [PSA]: Secondary | ICD-10-CM

## 2024-07-11 ENCOUNTER — Ambulatory Visit: Payer: Self-pay

## 2024-07-11 NOTE — Telephone Encounter (Signed)
" °  FYI Only or Action Required?: FYI only for provider: appointment scheduled on 07/14/2023.  Patient was last seen in primary care on 04/05/2024 by Theophilus Andrews, Tully GRADE, MD.  Called Nurse Triage reporting Neck Pain and Dysphagia.  Symptoms began several months ago.  Interventions attempted: Nothing.  Symptoms are: gradually worsening.  Triage Disposition: See PCP When Office is Open (Within 3 Days)  Patient/caregiver understands and will follow disposition?: Yes  Copied from CRM #[id number]. Topic: Clinical - Red Word Triage >> Jul 11, 2024 12:49 PM Amber H wrote: [facility] that prompted transfer to Nurse Triage: Patient c/o swelling in left side of neck, difficulty swallowing, feeling of pressure shooting into head, he is a cancer survivor. Reason for Disposition  Neck pain present > 2 weeks  Answer Assessment - Initial Assessment Questions 1. ONSET: When did the pain begin?       A while ago over a year 2. LOCATION: Where does it hurt?      Left neck 3. PATTERN Does the pain come and go, or has it been constant since it started?      Constant and worsening, tender to touch 4. SEVERITY: How bad is the pain?  (Scale 0-10; or none or slight stiffness, mild, moderate, severe)     *No Answer* 5. RADIATION: Does the pain go anywhere else, shoot into your arms?     denies 6. CORD SYMPTOMS: Any weakness or numbness of the arms or legs?     denies 7. CAUSE: What do you think is causing the neck pain?     unsure 8. NECK OVERUSE: Any recent activities that involved turning or twisting the neck?     denies 9. OTHER SYMPTOMS: Do you have any other symptoms? (e.g., headache, fever, chest pain, difficulty breathing, neck swelling)     Painful swallowing  Protocols used: Neck Pain or Stiffness-A-AH  "

## 2024-07-13 ENCOUNTER — Encounter: Payer: Self-pay | Admitting: Internal Medicine

## 2024-07-13 ENCOUNTER — Ambulatory Visit: Admitting: Internal Medicine

## 2024-07-13 VITALS — BP 120/70 | HR 72 | Temp 97.4°F | Wt 146.7 lb

## 2024-07-13 DIAGNOSIS — R1310 Dysphagia, unspecified: Secondary | ICD-10-CM

## 2024-07-13 DIAGNOSIS — Z8572 Personal history of non-Hodgkin lymphomas: Secondary | ICD-10-CM | POA: Diagnosis not present

## 2024-07-13 DIAGNOSIS — M542 Cervicalgia: Secondary | ICD-10-CM

## 2024-07-13 NOTE — Progress Notes (Signed)
 "    Established Patient Office Visit     CC/Reason for Visit: Neck pain  HPI: Travis Burton is a 69 y.o. male who is coming in today for the above mentioned reasons. Past Medical History is significant for: B-cell lymphoma currently on observation who presents with about a week history of neck pain and odynophagia.  Has had some mild postnasal drip but no other URI/flu symptoms.   Past Medical/Surgical History: Past Medical History:  Diagnosis Date   Arthritis    both shoulders    Cancer (HCC)    lymphoma   Chronic dental infection 04/19/2015   Grade 3a follicular lymphoma of lymph nodes of multiple regions (HCC) 12/15/2014   Neck mass    left   Transfusion of blood product refused for religious reason    Tuberculosis 2009   treatment as a young person and treatment in 2009    Past Surgical History:  Procedure Laterality Date   CHOLECYSTECTOMY     COLONOSCOPY     MASS BIOPSY Left 11/28/2014   Procedure: OPEN  BIOPSY LEFT NECK MASS;  Surgeon: Norleen Notice, MD;  Location: [facility];  Service: ENT;  Laterality: Left;   VASECTOMY     VASECTOMY REVERSAL  06/30/2002    Social History:  reports that he has never smoked. He has never used smokeless tobacco. He reports current alcohol use of about 2.0 standard drinks of alcohol per week. He reports that he does not use drugs.  Allergies: Allergies[1]  Family History:  Family History  Problem Relation Age of Onset   Breast cancer Mother 65       breast ca   Colon cancer Neg Hx    Colon polyps Neg Hx    Esophageal cancer Neg Hx    Rectal cancer Neg Hx    Stomach cancer Neg Hx     Current Medications[2]  Review of Systems:  Negative unless indicated in HPI.   Physical Exam: Vitals:   07/13/24 0917  BP: 120/70  Pulse: 72  Temp: (!) 97.4 F (36.3 C)  TempSrc: Oral  SpO2: 98%  Weight: 146 lb 11.2 oz (66.5 kg)    Body mass index is 22.81 kg/m.   Physical Exam HENT:      Mouth/Throat:     Pharynx: Posterior oropharyngeal erythema present.  Neck:   Pulmonary:     Effort: Pulmonary effort is normal.     Breath sounds: Normal breath sounds. No wheezing, rhonchi or rales.  Lymphadenopathy:     Cervical: Cervical adenopathy present.     Left cervical: Superficial cervical adenopathy and posterior cervical adenopathy present.      Impression and Plan:  Neck pain -     CT SOFT TISSUE NECK W CONTRAST; Future  Odynophagia -     CT SOFT TISSUE NECK W CONTRAST; Future  History of B-cell lymphoma -     CT SOFT TISSUE NECK W CONTRAST; Future   - With his history of B-cell lymphoma and palpable left-sided cervical adenopathy I think it is reasonable to proceed with a CT of the neck with contrast. - In the meantime have advised daily antihistamines.  Time spent:30 minutes reviewing chart, interviewing and examining patient and formulating plan of care.     Travis Theophilus Andrews, MD [place] [facility]     [1]  Allergies Allergen Reactions   Allopurinol  Hives  [2]  Current Outpatient Medications:    Cholecalciferol (VITAMIN D3 PO), Take by  mouth. (Patient not taking: Reported on 07/13/2024), Disp: , Rfl:   "

## 2024-08-02 ENCOUNTER — Other Ambulatory Visit: Payer: Self-pay | Admitting: Urology

## 2024-08-02 DIAGNOSIS — R972 Elevated prostate specific antigen [PSA]: Secondary | ICD-10-CM

## 2024-08-03 ENCOUNTER — Encounter: Payer: Self-pay | Admitting: Urology

## 2024-09-14 ENCOUNTER — Other Ambulatory Visit

## 2025-04-06 ENCOUNTER — Encounter: Admitting: Internal Medicine
